# Patient Record
Sex: Male | Born: 1955 | Race: White | Hispanic: No | Marital: Married
Health system: Southern US, Community
[De-identification: ages and names within clinical notes are randomized; demographics above are authoritative.]

---

## 2001-03-29 ENCOUNTER — Ambulatory Visit (HOSPITAL_COMMUNITY): Admission: RE | Admit: 2001-03-29 | Discharge: 2001-03-29 | Payer: Self-pay

## 2013-07-03 ENCOUNTER — Inpatient Hospital Stay: Payer: Self-pay | Admitting: Internal Medicine

## 2013-07-03 LAB — PRO B NATRIURETIC PEPTIDE: B-Type Natriuretic Peptide: 11153 pg/mL — ABNORMAL HIGH (ref 0–125)

## 2013-07-03 LAB — CBC
HCT: 39.4 % — ABNORMAL LOW (ref 40.0–52.0)
HGB: 13.1 g/dL (ref 13.0–18.0)
MCH: 38.2 pg — ABNORMAL HIGH (ref 26.0–34.0)
MCHC: 33.2 g/dL (ref 32.0–36.0)
MCV: 115 fL — ABNORMAL HIGH (ref 80–100)
Platelet: 93 10*3/uL — ABNORMAL LOW (ref 150–440)
RBC: 3.43 10*6/uL — ABNORMAL LOW (ref 4.40–5.90)
RDW: 16.4 % — ABNORMAL HIGH (ref 11.5–14.5)
WBC: 26.4 10*3/uL — ABNORMAL HIGH (ref 3.8–10.6)

## 2013-07-03 LAB — BASIC METABOLIC PANEL
Anion Gap: 20 — ABNORMAL HIGH (ref 7–16)
BUN: 23 mg/dL — ABNORMAL HIGH (ref 7–18)
Calcium, Total: 8.7 mg/dL (ref 8.5–10.1)
Chloride: 98 mmol/L (ref 98–107)
Co2: 17 mmol/L — ABNORMAL LOW (ref 21–32)
Creatinine: 3.27 mg/dL — ABNORMAL HIGH (ref 0.60–1.30)
EGFR (African American): 23 — ABNORMAL LOW
EGFR (Non-African Amer.): 20 — ABNORMAL LOW
Glucose: 97 mg/dL (ref 65–99)
Osmolality: 274 (ref 275–301)
Potassium: 3.4 mmol/L — ABNORMAL LOW (ref 3.5–5.1)
Sodium: 135 mmol/L — ABNORMAL LOW (ref 136–145)

## 2013-07-03 LAB — PROTIME-INR
INR: 1.4
Prothrombin Time: 17.4 secs — ABNORMAL HIGH (ref 11.5–14.7)

## 2013-07-03 LAB — HEPATIC FUNCTION PANEL A (ARMC)
Albumin: 2.4 g/dL — ABNORMAL LOW (ref 3.4–5.0)
Alkaline Phosphatase: 134 U/L (ref 50–136)
SGOT(AST): 385 U/L — ABNORMAL HIGH (ref 15–37)
SGPT (ALT): 106 U/L — ABNORMAL HIGH (ref 12–78)

## 2013-07-03 LAB — DIFFERENTIAL
Basophil #: 0 10*3/uL (ref 0.0–0.1)
Basophil %: 0.2 %
Eosinophil #: 0 10*3/uL (ref 0.0–0.7)
Eosinophil %: 0 %
Lymphocyte #: 1.5 10*3/uL (ref 1.0–3.6)
Lymphocyte %: 5.6 %
Monocyte #: 1.5 x10 3/mm — ABNORMAL HIGH (ref 0.2–1.0)
Monocyte %: 5.6 %
Neutrophil #: 23.4 10*3/uL — ABNORMAL HIGH (ref 1.4–6.5)
Neutrophil %: 88.6 %

## 2013-07-03 LAB — ETHANOL
Ethanol %: 0.041 % (ref 0.000–0.080)
Ethanol: 41 mg/dL

## 2013-07-03 LAB — TROPONIN I: Troponin-I: 2.2 ng/mL — ABNORMAL HIGH

## 2013-07-03 LAB — CK-MB
CK-MB: 3.8 ng/mL — ABNORMAL HIGH (ref 0.5–3.6)
CK-MB: 5.8 ng/mL — ABNORMAL HIGH (ref 0.5–3.6)

## 2013-07-04 LAB — TROPONIN I: Troponin-I: 1.6 ng/mL — ABNORMAL HIGH

## 2013-07-04 LAB — CBC WITH DIFFERENTIAL/PLATELET
Basophil #: 0 10*3/uL (ref 0.0–0.1)
Basophil %: 0.2 %
Eosinophil #: 0 10*3/uL (ref 0.0–0.7)
HGB: 13 g/dL (ref 13.0–18.0)
Lymphocyte %: 5.1 %
MCH: 38.6 pg — ABNORMAL HIGH (ref 26.0–34.0)
MCHC: 34.2 g/dL (ref 32.0–36.0)
MCV: 113 fL — ABNORMAL HIGH (ref 80–100)
Monocyte #: 0.7 x10 3/mm (ref 0.2–1.0)
Monocyte %: 3.2 %
Neutrophil %: 91.5 %
RDW: 16.1 % — ABNORMAL HIGH (ref 11.5–14.5)
WBC: 21.7 10*3/uL — ABNORMAL HIGH (ref 3.8–10.6)

## 2013-07-04 LAB — DRUG SCREEN, URINE
Barbiturates, Ur Screen: NEGATIVE (ref ?–200)
Benzodiazepine, Ur Scrn: NEGATIVE (ref ?–200)
Cannabinoid 50 Ng, Ur ~~LOC~~: NEGATIVE (ref ?–50)
Cocaine Metabolite,Ur ~~LOC~~: NEGATIVE (ref ?–300)
MDMA (Ecstasy)Ur Screen: NEGATIVE (ref ?–500)
Methadone, Ur Screen: NEGATIVE (ref ?–300)
Opiate, Ur Screen: NEGATIVE (ref ?–300)
Phencyclidine (PCP) Ur S: NEGATIVE (ref ?–25)
Tricyclic, Ur Screen: NEGATIVE (ref ?–1000)

## 2013-07-04 LAB — BASIC METABOLIC PANEL
Anion Gap: 12 (ref 7–16)
BUN: 32 mg/dL — ABNORMAL HIGH (ref 7–18)
Calcium, Total: 8.1 mg/dL — ABNORMAL LOW (ref 8.5–10.1)
Co2: 25 mmol/L (ref 21–32)
EGFR (African American): 21 — ABNORMAL LOW
EGFR (Non-African Amer.): 18 — ABNORMAL LOW
Glucose: 112 mg/dL — ABNORMAL HIGH (ref 65–99)
Osmolality: 276 (ref 275–301)

## 2013-07-04 LAB — CLOSTRIDIUM DIFFICILE(ARMC)

## 2013-07-04 LAB — URIC ACID: Uric Acid: 8 mg/dL — ABNORMAL HIGH (ref 3.5–7.2)

## 2013-07-04 LAB — URINALYSIS, COMPLETE
Glucose,UR: NEGATIVE mg/dL (ref 0–75)
Ketone: NEGATIVE
Nitrite: NEGATIVE
Ph: 5 (ref 4.5–8.0)
WBC UR: 58 /HPF (ref 0–5)

## 2013-07-04 LAB — CK-MB: CK-MB: 7.1 ng/mL — ABNORMAL HIGH (ref 0.5–3.6)

## 2013-07-04 LAB — MAGNESIUM: Magnesium: 0.6 mg/dL — ABNORMAL LOW

## 2013-07-04 LAB — APTT
Activated PTT: >160 secs (ref 23.6–35.9)
Activated PTT: >160 secs (ref 23.6–35.9)

## 2013-07-05 LAB — CBC WITH DIFFERENTIAL/PLATELET
Basophil #: 0.1 10*3/uL (ref 0.0–0.1)
Basophil %: 0.3 %
Eosinophil #: 0 10*3/uL (ref 0.0–0.7)
Eosinophil %: 0 %
HCT: 36 % — ABNORMAL LOW (ref 40.0–52.0)
HGB: 12.3 g/dL — ABNORMAL LOW (ref 13.0–18.0)
Lymphocyte #: 1.1 10*3/uL (ref 1.0–3.6)
Lymphocyte %: 6.1 %
MCHC: 34.2 g/dL (ref 32.0–36.0)
MCV: 113 fL — ABNORMAL HIGH (ref 80–100)
Monocyte #: 0.7 x10 3/mm (ref 0.2–1.0)
Monocyte %: 4.2 %
Neutrophil #: 15.8 10*3/uL — ABNORMAL HIGH (ref 1.4–6.5)
Neutrophil %: 89.4 %
Platelet: 55 10*3/uL — ABNORMAL LOW (ref 150–440)
RBC: 3.2 10*6/uL — ABNORMAL LOW (ref 4.40–5.90)
RDW: 16.1 % — ABNORMAL HIGH (ref 11.5–14.5)
WBC: 17.7 10*3/uL — ABNORMAL HIGH (ref 3.8–10.6)

## 2013-07-05 LAB — COMPREHENSIVE METABOLIC PANEL
Alkaline Phosphatase: 61 U/L
Calcium, Total: 7.9 mg/dL — ABNORMAL LOW (ref 8.5–10.1)
Chloride: 97 mmol/L — ABNORMAL LOW (ref 98–107)
SGPT (ALT): 100 U/L — ABNORMAL HIGH (ref 12–78)
Total Protein: 8.2 g/dL (ref 6.4–8.2)

## 2013-07-06 LAB — BASIC METABOLIC PANEL
BUN: 46 mg/dL — ABNORMAL HIGH (ref 7–18)
Co2: 31 mmol/L (ref 21–32)
EGFR (African American): 56 — ABNORMAL LOW
EGFR (Non-African Amer.): 49 — ABNORMAL LOW
Osmolality: 283 (ref 275–301)
Potassium: 3.5 mmol/L (ref 3.5–5.1)

## 2013-07-06 LAB — CBC WITH DIFFERENTIAL/PLATELET
Basophil #: 0 10*3/uL (ref 0.0–0.1)
Basophil %: 0.2 %
Eosinophil #: 0 10*3/uL (ref 0.0–0.7)
Eosinophil %: 0 %
HGB: 12.2 g/dL — ABNORMAL LOW (ref 13.0–18.0)
Lymphocyte #: 0.9 10*3/uL — ABNORMAL LOW (ref 1.0–3.6)
Lymphocyte %: 7.6 %
MCHC: 33.6 g/dL (ref 32.0–36.0)
Monocyte %: 3.7 %
Neutrophil %: 88.5 %
RBC: 3.21 10*6/uL — ABNORMAL LOW (ref 4.40–5.90)
WBC: 11.9 10*3/uL — ABNORMAL HIGH (ref 3.8–10.6)

## 2013-07-07 LAB — URINALYSIS, COMPLETE
Bacteria: NONE SEEN
Ketone: NEGATIVE
Leukocyte Esterase: NEGATIVE
Ph: 6 (ref 4.5–8.0)
Protein: 30
RBC,UR: 176 /HPF (ref 0–5)
Specific Gravity: 1.018 (ref 1.003–1.030)
Squamous Epithelial: <1
WBC UR: 34 /HPF (ref 0–5)

## 2013-07-07 LAB — BASIC METABOLIC PANEL
Anion Gap: 4 — ABNORMAL LOW (ref 7–16)
BUN: 49 mg/dL — ABNORMAL HIGH (ref 7–18)
Chloride: 98 mmol/L (ref 98–107)
Co2: 32 mmol/L (ref 21–32)
Creatinine: 1.45 mg/dL — ABNORMAL HIGH (ref 0.60–1.30)
Glucose: 159 mg/dL — ABNORMAL HIGH (ref 65–99)
Osmolality: 285 (ref 275–301)
Potassium: 3.4 mmol/L — ABNORMAL LOW (ref 3.5–5.1)
Sodium: 134 mmol/L — ABNORMAL LOW (ref 136–145)

## 2013-07-07 LAB — CBC WITH DIFFERENTIAL/PLATELET
Basophil %: 0.1 %
Eosinophil #: 0 10*3/uL (ref 0.0–0.7)
HCT: 35.6 % — ABNORMAL LOW (ref 40.0–52.0)
Lymphocyte %: 5.5 %
MCH: 37.8 pg — ABNORMAL HIGH (ref 26.0–34.0)
MCHC: 33.4 g/dL (ref 32.0–36.0)
MCV: 113 fL — ABNORMAL HIGH (ref 80–100)
Monocyte %: 6.6 %
RDW: 15.5 % — ABNORMAL HIGH (ref 11.5–14.5)

## 2013-07-07 LAB — CULTURE, BLOOD (SINGLE)

## 2013-07-08 LAB — CBC WITH DIFFERENTIAL/PLATELET
Basophil #: 0 10*3/uL (ref 0.0–0.1)
Eosinophil %: 0 %
HCT: 37.5 % — ABNORMAL LOW (ref 40.0–52.0)
HGB: 12.8 g/dL — ABNORMAL LOW (ref 13.0–18.0)
Lymphocyte #: 0.9 10*3/uL — ABNORMAL LOW (ref 1.0–3.6)
MCHC: 34.2 g/dL (ref 32.0–36.0)
Monocyte #: 0.7 x10 3/mm (ref 0.2–1.0)
Monocyte %: 5.7 %
Neutrophil %: 87.6 %
RBC: 3.34 10*6/uL — ABNORMAL LOW (ref 4.40–5.90)

## 2013-07-08 LAB — BASIC METABOLIC PANEL
Anion Gap: 6 — ABNORMAL LOW (ref 7–16)
Calcium, Total: 8.8 mg/dL (ref 8.5–10.1)
Co2: 32 mmol/L (ref 21–32)
Creatinine: 1.26 mg/dL (ref 0.60–1.30)
EGFR (African American): >60
Potassium: 3.4 mmol/L — ABNORMAL LOW (ref 3.5–5.1)

## 2013-07-08 LAB — CULTURE, BLOOD (SINGLE)

## 2013-07-09 LAB — CBC WITH DIFFERENTIAL/PLATELET
Basophil #: 0 10*3/uL (ref 0.0–0.1)
Eosinophil #: 0 10*3/uL (ref 0.0–0.7)
Eosinophil %: 0 %
HCT: 38.4 % — ABNORMAL LOW (ref 40.0–52.0)
HGB: 13.4 g/dL (ref 13.0–18.0)
Lymphocyte #: 0.9 10*3/uL — ABNORMAL LOW (ref 1.0–3.6)
MCH: 38.9 pg — ABNORMAL HIGH (ref 26.0–34.0)
MCHC: 34.8 g/dL (ref 32.0–36.0)
MCV: 112 fL — ABNORMAL HIGH (ref 80–100)
Monocyte #: 0.5 x10 3/mm (ref 0.2–1.0)
Neutrophil #: 10.4 10*3/uL — ABNORMAL HIGH (ref 1.4–6.5)
Neutrophil %: 88.3 %
Platelet: 101 10*3/uL — ABNORMAL LOW (ref 150–440)
RBC: 3.43 10*6/uL — ABNORMAL LOW (ref 4.40–5.90)

## 2013-07-09 LAB — BASIC METABOLIC PANEL
Co2: 34 mmol/L — ABNORMAL HIGH (ref 21–32)
Creatinine: 1.21 mg/dL (ref 0.60–1.30)
Glucose: 162 mg/dL — ABNORMAL HIGH (ref 65–99)
Osmolality: 286 (ref 275–301)
Potassium: 3.5 mmol/L (ref 3.5–5.1)

## 2013-07-10 LAB — URINALYSIS, COMPLETE
Bacteria: NEGATIVE
Bilirubin,UR: NEGATIVE
Glucose,UR: NEGATIVE mg/dL (ref 0–75)
Ketone: NEGATIVE
Leukocyte Esterase: NEGATIVE
Protein: NEGATIVE
Specific Gravity: 1.005 (ref 1.003–1.030)
WBC UR: NONE SEEN /HPF (ref 0–5)

## 2013-07-10 LAB — PLATELET COUNT: Platelet: 105 10*3/uL — ABNORMAL LOW (ref 150–440)

## 2013-07-10 LAB — PROTEIN / CREATININE RATIO, URINE
Creatinine, Urine: 18.4 mg/dL — ABNORMAL LOW (ref 30.0–125.0)
Protein, Random Urine: 10 mg/dL (ref 0–12)

## 2013-07-10 LAB — CREATININE, SERUM
EGFR (African American): >60
EGFR (Non-African Amer.): >60

## 2013-07-10 LAB — POTASSIUM: Potassium: 3.6 mmol/L (ref 3.5–5.1)

## 2013-07-10 LAB — KAPPA/LAMBDA FREE LIGHT CHAINS (ARMC)

## 2013-07-10 LAB — PROTEIN ELECTROPHORESIS(ARMC)

## 2013-07-11 LAB — BASIC METABOLIC PANEL
BUN: 36 mg/dL — ABNORMAL HIGH (ref 7–18)
Calcium, Total: 8.2 mg/dL — ABNORMAL LOW (ref 8.5–10.1)
Co2: 38 mmol/L — ABNORMAL HIGH (ref 21–32)
Creatinine: 1.08 mg/dL (ref 0.60–1.30)
EGFR (African American): >60
EGFR (Non-African Amer.): >60
Glucose: 115 mg/dL — ABNORMAL HIGH (ref 65–99)
Osmolality: 285 (ref 275–301)
Sodium: 138 mmol/L (ref 136–145)

## 2014-12-04 NOTE — Consult Note (Signed)
Chief Complaint:  Subjective/Chief Complaint Pt c/o mild sob. He is still having mild left pain. Leg swelling.   VITAL SIGNS/ANCILLARY NOTES: **Vital Signs.:   22-Nov-14 12:11  Vital Signs Type Q 4hr  Temperature Temperature (F) 97.4  Celsius 36.3  Temperature Source oral  Pulse Pulse 94  Respirations Respirations 22  Systolic BP Systolic BP 300  Diastolic BP (mmHg) Diastolic BP (mmHg) 88  Mean BP 107  Pulse Ox % Pulse Ox % 94  Pulse Ox Activity Level  At rest  Oxygen Delivery 3L  *Intake and Output.:   Daily 22-Nov-14 07:00  Grand Totals Intake:  267 Output:  2065    Net:  -1798 24 Hr.:  -1798  Heparin      In:  154.5  IV (Secondary)      In:  87.5  IV (Secondary)      In:  25  Urine ml     Out:  2065  Length of Stay Totals Intake:  1041.1 Output:  2675    Net:  -1633.9   Brief Assessment:  GEN well developed, well nourished, no acute distress, obese   Cardiac Regular  murmur present  + LE edema  + JVD  --Gallop   Respiratory normal resp effort  wheezing  rhonchi   Gastrointestinal Normal   Gastrointestinal details normal Soft  Nontender  Nondistended   EXTR negative cyanosis/clubbing, positive edema   Lab Results: Routine Chem:  23-Nov-14 05:11   Glucose, Serum  146  BUN  46  Creatinine (comp)  1.57  Sodium, Serum  134  Potassium, Serum 3.5  Chloride, Serum 99  CO2, Serum 31  Calcium (Total), Serum 8.6  Anion Gap  4  Osmolality (calc) 283  eGFR (African American)  56  eGFR (Non-African American)  49 (eGFR values <70m/min/1.73 m2 may be an indication of chronic kidney disease (CKD). Calculated eGFR is useful in patients with stable renal function. The eGFR calculation will not be reliable in acutely ill patients when serum creatinine is changing rapidly. It is not useful in  patients on dialysis. The eGFR calculation may not be applicable to patients at the low and high extremes of body sizes, pregnant women, and vegetarians.)  Routine Hem:   23-Nov-14 05:11   WBC (CBC)  11.9  RBC (CBC)  3.21  Hemoglobin (CBC)  12.2  Hematocrit (CBC)  36.4  Platelet Count (CBC)  55  MCV  114  MCH  38.2  MCHC 33.6  RDW  15.8  Neutrophil % 88.5  Lymphocyte % 7.6  Monocyte % 3.7  Eosinophil % 0.0  Basophil % 0.2  Neutrophil #  10.5  Lymphocyte #  0.9  Monocyte # 0.4  Eosinophil # 0.0  Basophil # 0.0 (Result(s) reported on 06 Jul 2013 at 0[facility])   Radiology Results: XRay:    20-Nov-14 14:47, Chest Portable Single View  Chest Portable Single View   REASON FOR EXAM:    shortness of breath  COMMENTS:       PROCEDURE: DXR - DXR PORTABLE CHEST SINGLE VIEW  - Jul 03 2013  2:47PM     CLINICAL DATA:  Shortness of breath.    EXAM:  PORTABLE CHEST - 1 VIEW    COMPARISON:  None.    FINDINGS:  The cardiacsilhouette is enlarged. The mediastinal contours  otherwise unremarkable. Low lung volumes. There is no evidence of  focal infiltrates, effusions, nor edema. The osseous structures are  unremarkable.     IMPRESSION:  Cardiomegaly without  evidence of acute cardiopulmonary disease.      Electronically Signed    By: Margaree Mackintosh M.D.    On: 07/03/2013 14:55         Verified By: Mikki Santee, M.D., MD    [phone] 10:13, Chest PA and Lateral  Chest PA and Lateral   REASON FOR EXAM:    follow up [place]  COMMENTS:       PROCEDURE: DXR - DXR CHEST PA (OR AP) AND LATERAL  - Jul 05 2013 10:13AM     CLINICAL DATA:  Left lower extremity swelling.    EXAM:  CHEST  2 VIEW    COMPARISON:  July 03, 2013.    FINDINGS:  Stable cardiomegaly. No pleural effusion or pneumothorax is noted.  Right lung is clear. Minimal linear density is noted in left lung  base most consistent with subsegmental atelectasis. Bony thorax  appears intact.     IMPRESSION:  Minimal subsegmental atelectasis seen in left lung base. No other  abnormality seen.      Electronically Signed    By: Sabino Dick M.D.    On: 07/05/2013 10:15          Verified By: Marveen Reeks, M.D.,  [place]:    20-Nov-14 17:37, US Abdomen General Survey  US Abdomen General Survey   REASON FOR EXAM:    abd distention, elevated lfts, renal failure  COMMENTS:       PROCEDURE: [place]  - US ABDOMEN GENERAL SURVEY  - Jul 03 2013  5:37PM     CLINICAL DATA:  Abdominal distention, elevated LFTs, renal failure.    EXAM:  ULTRASOUND ABDOMEN COMPLETE    COMPARISON:  None.    FINDINGS:  Gallbladder  No gallstones or wall thickening visualized. No sonographic Murphy  sign noted.    Common bile duct    Diameter: 3.8 mm.    Liver    A paddle megaly at 23.4 cm measured along the midclavicular line.  Heterogeneous echotexture without gross focal abnormalities    IVC    No abnormality visualized.  Pancreas    Visualized portion unremarkable.    Spleen    Splenomegaly at 18 cm in longitudinal dimensions with a homogeneous  echotexture    RightKidney    Length: 13.4 cm. Echogenicity within normal limits. No mass or  hydronephrosis visualized.    Left Kidney  Length: 11.6 cm. Echogenicity within normal limits. No mass or  hydronephrosis visualized.    Abdominal aorta    Not visualized     IMPRESSION:  1. Hepatosplenomegaly  2. Hepatic steatosis      Electronically Signed    By: Margaree Mackintosh M.D.    On: 07/03/2013 17:42     Verified By: Mikki Santee, M.D., MD    [phone] 10:02, [place] Color Flow Doppler Low Extrem Bilat (Legs)  [place] Color Flow Doppler Low Extrem Bilat (Legs)   REASON FOR EXAM:    bilateral leg swelling  COMMENTS:       PROCEDURE: [place]  - US DOPPLER LOW EXTR BILATERAL  - Jul 05 2013 10:02AM     CLINICAL DATA:  Bilateral lower extremity swelling.    EXAM:  VENOUS DOPPLER ULTRASOUND OF BILATERAL LOWER EXTREMITIES    TECHNIQUE:  Gray-scale sonography with graded compression, as well as color  Doppler and duplex ultrasound, were performed to evaluate the deep  venous system from the level of the common  femoral vein through the  popliteal and proximal  calf veins. Spectral Doppler was utilized to  evaluate flow at rest and with distal augmentation maneuvers.    COMPARISON:  No priors.    FINDINGS:  Thrombus within deep veins:  None visualized.    Compressibility of deep veins:  Normal.    Duplex waveform respiratory phasicity:  Normal.    Duplex waveform response to augmentation:  Normal.    Venous reflux:  None visualized.  Other findings: Edema in the subcutaneous fat of the left lower  extremity near the ankle.     IMPRESSION:  1. No evidence deep venous thrombosis in either lower extremity.      Electronically Signed    By: Vinnie Langton M.D.    On: 07/05/2013 11:22         Verified By: Etheleen Mayhew, M.D.,  Cardiology:    20-Nov-14 14:18, ED ECG  Ventricular Rate 117  Atrial Rate 117  P-R Interval 134  QRS Duration 84  QT 348  QTc 485  P Axis 15  R Axis -58  T Axis 31  ECG interpretation   Sinus tachycardia  Left anterior fascicular block  Abnormal ECG  No previous ECGs available  ----------unconfirmed----------  Confirmed by OVERREAD, NOT ([id number]), editor PEARSON, BARBARA ([id number]) on 07/07/2013 1:07:54 PM  ED ECG     21-Nov-14 10:38, Echo Doppler  Echo Doppler   REASON FOR EXAM:      COMMENTS:       PROCEDURE: [facility] - ECHO DOPPLER COMPLETE(TRANSTHOR)  - Jul 04 2013 10:38AM     RESULT: Echocardiogram Report    Patient Name:   SAVIAN MAZON Date of Exam: 07/04/2013  Medical Rec #:  [id number]          Custom1:  Date of Birth:  [birth year]       Height:       70.1 in  Patient Age:    31 years        Weight:       280.0 lb  Patient Gender: M               BSA:          2.41 m??    Indications: SOB  Sonographer:    Sherrie Sport RDCS  Referring Phys: [place]stone Lighter    Summary:   1. Left ventricular ejection fraction, by visual estimation, is 70 to   75%.   2. Normal global left ventricular systolic function.   3. Mild to moderately increased left  ventricular internal cavity size.   4. Mildly dilated left atrium.   5. Mild aortic valve sclerosis without stenosis.   6. Severely increased left ventricular posterior wall thickness.   7. Mild tricuspid regurgitation.  2D AND M-MODE MEASUREMENTS (normal ranges within parentheses):  Left Ventricle:          Normal  IVSd (2D):      1.09 cm (0.7-1.1)  LVPWd (2D):     1.53 cm (0.7-1.1) Aorta/LA:                  Normal  LVIDd (2D):     5.58 cm (3.4-5.7) Aortic Root(2D): 3.10 cm (2.4-3.7)  LVIDs (2D):     3.29 cm           Left Atrium (2D): 4.40 cm (1.9-4.0)  LV FS (2D):     41.0 %   (>25%)  LV EF (2D):     71.3 %   (>50%)  Right Ventricle:                                    RVd (2D):        4.25 cm  LV DIASTOLIC FUNCTION:  MV Peak E: 1.20 m/s Decel Time: 137 msec  MV Peak A:0.91 m/s  E/A Ratio: 1.32  SPECTRAL DOPPLER ANALYSIS (where applicable):  Mitral Valve:  MV P1/2 Time: 39.73 msec  MV Area, PHT: 5.54 cm??  Aortic Valve: AoV Max Vel: 2.08 m/s AoV Peak PG: 17.2 mmHg AoV Mean PG:     15.0 mmHg  LVOT Vmax: 1.09 m/s LVOTVTI: 0.244 m LVOT Diameter: 2.20 cm  AoV Area, Vmax: 2.00 cm?? AoV Area, VTI: 2.20 cm?? AoV Area, Vmn: 1.96 cm??  Tricuspid Valve and PA/RV Systolic Pressure: TR Max Velocity: 2.22 m/s RA   Pressure: 5 mmHg RVSP/PASP: 24.8 mmHg  Pulmonic Valve:  PV Max Velocity: 1.30 m/s PV Max PG: 6.8 mmHg PV Mean PG:    PHYSICIAN INTERPRETATION:  Left Ventricle: The left ventricular internal cavity size was mild to   moderately increased. LV septal wall thickness was normal. LV posterior   wall thickness was severely increased. Global LV systolic function was   normal. Left ventricular ejection fraction, by visual estimation, is 70   to 75%.  Right Ventricle: The right ventricular size is normal. Global RV systolic     function is normal.  Left Atrium: The left atrium is mildly dilated.  Right Atrium: The right atrium is normal in  size.  Pericardium: There is no evidence of pericardial effusion.  Mitral Valve: The mitral valve is normal in structure. Trace mitral valve   regurgitation is seen.  Tricuspid Valve: The tricuspid valve is normal. Mild tricuspid   regurgitation is visualized. The tricuspid regurgitant velocity is 2.22   m/s, and with an assumed right atrial pressure of 5 mmHg, the estimated   right ventricular systolic pressure is normal at 24.8 mmHg.  Aortic Valve: The aortic valve is normal. Mild aortic valve sclerosis is   present, with no evidence of aortic valve stenosis. No evidence of aortic   valve regurgitation is seen.  Pulmonic Valve: The pulmonic valve is normal.  4 Quindarius Cabello MD  Electronically signed by Coy MD  Signature Date/Time: 07/04/2013/3:35:12 PM    *** Final ***    IMPRESSION: .        Verified By: Yolonda Kida, M.D., MD  CT:    20-Nov-14 19:10, CT Chest Abdomen and Pelvis WO  CT Chest Abdomen and Pelvis WO   REASON FOR EXAM:    (1) severe sepsis, CHF; (2) severe sepsis, elevated   lactic acid, unknown source  COMMENTS:       PROCEDURE: CT  - CT CHEST ABDOMEN AND PELVIS WO  - Jul 03 2013  7:10PM     CLINICAL DATA:  Chest pain and short of breath, confusion. Ethanol  abuse. Congestive heart failure. Sepsis. Elevated lactic acid.    EXAM:  CT CHEST, ABDOMEN AND PELVIS WITHOUT CONTRAST    TECHNIQUE:  Multidetector CT imaging of the chest, abdomen and pelvis was  performed following the standard protocol without IV contrast.  COMPARISON:  Ultrasound on 07/03/2013    FINDINGS:  CT CHEST FINDINGS    No axillary supraclavicular lymphadenopathy. No mediastinal hilar  lymphadenopathy. No pericardial fluid.    No focal consolidation. No pleural fluid or pneumothorax.  There is  mild interlobular septal thickening consistent with mild edema.  There is some atelectasis at the left lung base this small focus of  consolidation pneumonitis.  (image 37).    No focal hepatic lesion on this noncontrast exam.  CT ABDOMEN AND PELVIS FINDINGS    No focal hepatic lesion on this noncontrast exam. Diffuse hepatic  steatosis noted. Enlarged caudate lobe. No ascites. The gallbladder,  pancreas, spleen, adrenal glands, kidneys are normal. The stomach,  small bowel, and cecum are normal. The colon is collapse.    Abdominal or is normal caliber. No retroperitoneal periportal  lymphadenopathy. No central mesenteric disease.    No free fluid the pelvis. This Foley catheter in the collapsed  bladder. There is an enlarged left external iliac lymph node  measuring 11 mm short axis. There is enlarged left inguinal lymph  node measuring 13 mm (image 29). There is mild subcutaneous edema  within the left upper thigh.   IMPRESSION:  CT CHEST IMPRESSION    1. Mild atelectasis and potential pneumonitis or pneumonia at the  left lung base. Consider mild aspiration pneumonitis.  2. Mild interstitial edema.    CT ABDOMEN AND PELVIS IMPRESSION    1. Hepatic steatosis.  2. Enlargement of the caudate lobe suggests cirrhotic change.  3. Left external iliac adenopathy and inguinal adenopathy with  associated subcutaneous edema in the proximal left thigh. Query left  lower extremity infection or thrombus.  Electronically Signed    By: Suzy Bouchard M.D.    On: 11/20/201419:32         Verified By: Rennis Golden, M.D.,   Assessment/Plan:  Invasive Device Daily Assessment of Necessity:  Does the patient currently have any of the following indwelling devices? foley  central line catheter   Indwelling Urinary Catheter continued, requirement due to   Reason to continue Indwelling Urinary Catheter for strict Intake/Output monitoring for hemodynamic instability  immobilization due to sedation/paralysis   Central Line continued, requirement due to   Reason to continue [employer] for frequent venous blood sampling    Assessment/Plan:  Assessment IMP Elevated troponin ARF OSA HTN Edema Sepsis Hepatitis ETOH abuse Pneumonia Psoriatic arthritits . Marland Kitchen   Plan PLAN Antibx IV 02 [place] 2L Agree with nephrology input CIWA for ETOH Bp control DVT prophylaxis Hold off on lasix for now Awaiting blood culture results Agree with ECHO   Electronic Signatures: Lujean Amel D (MD)  (Signed [phone] 20:04)  Authored: Chief Complaint, VITAL SIGNS/ANCILLARY NOTES, Brief Assessment, Lab Results, Radiology Results, Assessment/Plan   Last Updated: 26-Nov-14 20:04 by Lujean Amel D (MD)

## 2014-12-04 NOTE — Consult Note (Signed)
Chief Complaint:  Subjective/Chief Complaint Feeling better today. Much more alert.Pt c/o mild sob. He is still having mild left pain. Leg swelling. Denies f/c/s.   VITAL SIGNS/ANCILLARY NOTES: **Vital Signs.:   23-Nov-14 08:48  Pulse Pulse 85  Respirations Respirations 20  Systolic BP Systolic BP 962  Diastolic BP (mmHg) Diastolic BP (mmHg) 88  Mean BP 105  Pulse Ox % Pulse Ox % 96  Oxygen Delivery 3L; Nasal Cannula  *Intake and Output.:   23-Nov-14 05:44  Grand Totals Intake:  571 Output:  450    Net:  121 54 Hr.:  -464  IV (Primary)      In:  521  IV (Secondary)      In:  50  Weight Type daily  Weight Method Bed  Current Weight (lbs) (lbs) 276  Current Weight (kg) (kg) 125.1  Height (ft) (feet) 5  Height (in) (in) 10  Height (cm) centimeters 177.8  BSA (m2) 2.3  BMI (kg/m2) 39.5  Urine ml     Out:  450  Urinary Method  Foley   Brief Assessment:  (Data referenced from "Consultant's Daily Progress Note" 22-Nov-14 10:48)  GEN well developed, well nourished, no acute distress, obese   Cardiac Regular  murmur present  + LE edema  + JVD  --Gallop   Respiratory normal resp effort  wheezing  rhonchi   Gastrointestinal Normal   Gastrointestinal details normal Soft  Nontender  Nondistended   EXTR negative cyanosis/clubbing, positive edema   Lab Results: Routine Chem:  23-Nov-14 05:11   eGFR (African American)  56  eGFR (Non-African American)  49 (eGFR values <38m/min/1.73 m2 may be an indication of chronic kidney disease (CKD). Calculated eGFR is useful in patients with stable renal function. The eGFR calculation will not be reliable in acutely ill patients when serum creatinine is changing rapidly. It is not useful in  patients on dialysis. The eGFR calculation may not be applicable to patients at the low and high extremes of body sizes, pregnant women, and vegetarians.)  Potassium, Serum 3.5  Glucose, Serum  146  BUN  46  Sodium, Serum  134  Chloride,  Serum 99  CO2, Serum 31  Calcium (Total), Serum 8.6  Anion Gap  4  Osmolality (calc) 283  Routine Hem:  23-Nov-14 05:11   Platelet Count (CBC)  55  WBC (CBC)  11.9  RBC (CBC)  3.21  Hemoglobin (CBC)  12.2  Hematocrit (CBC)  36.4  MCV  114  MCH  38.2  MCHC 33.6  RDW  15.8  Neutrophil % 88.5  Lymphocyte % 7.6  Monocyte % 3.7  Eosinophil % 0.0  Basophil % 0.2  Neutrophil #  10.5  Lymphocyte #  0.9  Monocyte # 0.4  Eosinophil # 0.0  Basophil # 0.0 (Result(s) reported on 06 Jul 2013 at 0[facility])   Radiology Results: XRay:    20-Nov-14 14:47, Chest Portable Single View  Chest Portable Single View   REASON FOR EXAM:    shortness of breath  COMMENTS:       PROCEDURE: DXR - DXR PORTABLE CHEST SINGLE VIEW  - Jul 03 2013  2:47PM     CLINICAL DATA:  Shortness of breath.    EXAM:  PORTABLE CHEST - 1 VIEW    COMPARISON:  None.    FINDINGS:  The cardiacsilhouette is enlarged. The mediastinal contours  otherwise unremarkable. Low lung volumes. There is no evidence of  focal infiltrates, effusions, nor edema. The osseous structures are  unremarkable.  IMPRESSION:  Cardiomegaly without evidence of acute cardiopulmonary disease.      Electronically Signed    By: Margaree Mackintosh M.D.    On: 07/03/2013 14:55         Verified By: Mikki Santee, M.D., MD    [phone] 10:13, Chest PA and Lateral  Chest PA and Lateral   REASON FOR EXAM:    follow up p[place]  COMMENTS:       PROCEDURE: DXR - DXR CHEST PA (OR AP) AND LATERAL  - Jul 05 2013 10:13AM     CLINICAL DATA:  Left lower extremity swelling.    EXAM:  CHEST  2 VIEW    COMPARISON:  July 03, 2013.    FINDINGS:  Stable cardiomegaly. No pleural effusion or pneumothorax is noted.  Right lung is clear. Minimal linear density is noted in left lung  base most consistent with subsegmental atelectasis. Bony thorax  appears intact.     IMPRESSION:  Minimal subsegmental atelectasis seen in left lung base. No  other  abnormality seen.      Electronically Signed    By: Sabino Dick M.D.    On: 07/05/2013 10:15         Verified By: Marveen Reeks, M.D.,  [place]:    20-Nov-14 17:37, US Abdomen General Survey  US Abdomen General Survey   REASON FOR EXAM:    abd distention, elevated lfts, renal failure  COMMENTS:       PROCEDURE: [place]  - US ABDOMEN GENERAL SURVEY  - Jul 03 2013  5:37PM     CLINICAL DATA:  Abdominal distention, elevated LFTs, renal failure.    EXAM:  ULTRASOUND ABDOMEN COMPLETE    COMPARISON:  None.    FINDINGS:  Gallbladder  No gallstones or wall thickening visualized. No sonographic Murphy  sign noted.    Common bile duct    Diameter: 3.8 mm.    Liver    A paddle megaly at 23.4 cm measured along the midclavicular line.  Heterogeneous echotexture without gross focal abnormalities    IVC    No abnormality visualized.  Pancreas    Visualized portion unremarkable.    Spleen    Splenomegaly at 18 cm in longitudinal dimensions with a homogeneous  echotexture    RightKidney    Length: 13.4 cm. Echogenicity within normal limits. No mass or  hydronephrosis visualized.    Left Kidney  Length: 11.6 cm. Echogenicity within normal limits. No mass or  hydronephrosis visualized.    Abdominal aorta    Not visualized     IMPRESSION:  1. Hepatosplenomegaly  2. Hepatic steatosis      Electronically Signed    By: Margaree Mackintosh M.D.    On: 07/03/2013 17:42     Verified By: Mikki Santee, M.D., MD    [phone] 10:02, [place] Color Flow Doppler Low Extrem Bilat (Legs)  [place] Color Flow Doppler Low Extrem Bilat (Legs)   REASON FOR EXAM:    bilateral leg swelling  COMMENTS:       PROCEDURE: [place]  - US DOPPLER LOW EXTR BILATERAL  - Jul 05 2013 10:02AM     CLINICAL DATA:  Bilateral lower extremity swelling.    EXAM:  VENOUS DOPPLER ULTRASOUND OF BILATERAL LOWER EXTREMITIES    TECHNIQUE:  Gray-scale sonography with graded compression, as well as color  Doppler  and duplex ultrasound, were performed to evaluate the deep  venous system from the level of the common femoral vein through the  popliteal and proximal calf veins. Spectral Doppler was utilized to  evaluate flow at rest and with distal augmentation maneuvers.    COMPARISON:  No priors.    FINDINGS:  Thrombus within deep veins:  None visualized.    Compressibility of deep veins:  Normal.    Duplex waveform respiratory phasicity:  Normal.    Duplex waveform response to augmentation:  Normal.    Venous reflux:  None visualized.  Other findings: Edema in the subcutaneous fat of the left lower  extremity near the ankle.     IMPRESSION:  1. No evidence deep venous thrombosis in either lower extremity.      Electronically Signed    By: Vinnie Langton M.D.    On: 07/05/2013 11:22         Verified By: Etheleen Mayhew, M.D.,  Cardiology:    20-Nov-14 14:18, ED ECG  Ventricular Rate 117  Atrial Rate 117  P-R Interval 134  QRS Duration 84  QT 348  QTc 485  P Axis 15  R Axis -58  T Axis 31  ECG interpretation   Sinus tachycardia  Left anterior fascicular block  Abnormal ECG  No previous ECGs available  ----------unconfirmed----------  Confirmed by OVERREAD, NOT ([id number]), editor PEARSON, BARBARA ([id number]) on 07/07/2013 1:07:54 PM  ED ECG     21-Nov-14 10:38, Echo Doppler  Echo Doppler   REASON FOR EXAM:      COMMENTS:       PROCEDURE: [facility] - ECHO DOPPLER COMPLETE(TRANSTHOR)  - Jul 04 2013 10:38AM     RESULT: Echocardiogram Report    Patient Name:   TALLIN HART Date of Exam: 07/04/2013  Medical Rec #:  [id number]          Custom1:  Date of Birth:  05/16/1956       Height:       70.1 in  Patient Age:    59 years        Weight:       280.0 lb  Patient Gender: M               BSA:          2.41 m??    Indications: SOB  Sonographer:    Sherrie Sport RDCS  Referring Phys: Gladstone Lighter    Summary:   1. Left ventricular ejection fraction, by visual estimation, is 70 to    75%.   2. Normal global left ventricular systolic function.   3. Mild to moderately increased left ventricular internal cavity size.   4. Mildly dilated left atrium.   5. Mild aortic valve sclerosis without stenosis.   6. Severely increased left ventricular posterior wall thickness.   7. Mild tricuspid regurgitation.  2D AND M-MODE MEASUREMENTS (normal ranges within parentheses):  Left Ventricle:          Normal  IVSd (2D):      1.09 cm (0.7-1.1)  LVPWd (2D):     1.53 cm (0.7-1.1) Aorta/LA:                  Normal  LVIDd (2D):     5.58 cm (3.4-5.7) Aortic Root (2D): 3.10 cm (2.4-3.7)  LVIDs (2D):     3.29 cm           Left Atrium (2D): 4.40 cm (1.9-4.0)  LV FS (2D):     41.0 %   (>25%)  LV EF (2D):     71.3 %   (>50%)  Right Ventricle:                                    RVd (2D):        6.01 cm  LV DIASTOLIC FUNCTION:  MV Peak E: 1.20 m/s Decel Time: 137 msec  MV Peak A:0.91 m/s  E/A Ratio: 1.32  SPECTRAL DOPPLER ANALYSIS (where applicable):  Mitral Valve:  MV P1/2 Time: 39.73 msec  MV Area, PHT: 5.54 cm??  Aortic Valve: AoV Max Vel: 2.08 m/s AoV Peak PG: 17.2 mmHg AoV Mean PG:     15.0 mmHg  LVOT Vmax: 1.09 m/s LVOTVTI: 0.244 m LVOT Diameter: 2.20 cm  AoV Area, Vmax: 2.00 cm?? AoV Area, VTI: 2.20 cm?? AoV Area, Vmn: 1.96 cm??  Tricuspid Valve and PA/RV Systolic Pressure: TR Max Velocity: 2.22 m/s RA   Pressure: 5 mmHg RVSP/PASP: 24.8 mmHg  Pulmonic Valve:  PV Max Velocity: 1.30 m/s PV Max PG: 6.8 mmHg PV Mean PG:    PHYSICIAN INTERPRETATION:  Left Ventricle: The left ventricular internal cavity size was mild to   moderately increased. LV septal wall thickness was normal. LV posterior   wall thickness was severely increased. Global LV systolic function was   normal. Left ventricular ejection fraction, by visual estimation, is 70   to 75%.  Right Ventricle: The right ventricular size is normal. Global RV systolic     function is  normal.  Left Atrium: The left atrium is mildly dilated.  Right Atrium: The right atrium is normal in size.  Pericardium: There is no evidence of pericardial effusion.  Mitral Valve: The mitral valve is normal in structure. Trace mitral valve   regurgitation is seen.  Tricuspid Valve: The tricuspid valve is normal. Mild tricuspid   regurgitation is visualized. The tricuspid regurgitant velocity is 2.22   m/s, and with an assumed right atrial pressure of 5 mmHg, the estimated   right ventricular systolic pressure is normal at 24.8 mmHg.  Aortic Valve: The aortic valve is normal. Mild aortic valve sclerosis is   present, with no evidence of aortic valve stenosis. No evidence of aortic   valve regurgitation is seen.  Pulmonic Valve: The pulmonic valve is normal.  48 DwayneCallwood MD  Electronically signed by Monrovia MD  Signature Date/Time: 07/04/2013/3:35:12 PM    *** Final ***    IMPRESSION: .        Verified By: Yolonda Kida, M.D., MD  CT:    20-Nov-14 19:10, CT Chest Abdomen and Pelvis WO  CT Chest Abdomen and Pelvis WO   REASON FOR EXAM:    (1) severe sepsis, CHF; (2) severe sepsis, elevated   lactic acid, unknown source  COMMENTS:       PROCEDURE: CT  - CT CHEST ABDOMEN AND PELVIS WO  - Jul 03 2013  7:10PM     CLINICAL DATA:  Chest pain and short of breath, confusion. Ethanol  abuse. Congestive heart failure. Sepsis. Elevated lactic acid.    EXAM:  CT CHEST, ABDOMEN AND PELVIS WITHOUT CONTRAST    TECHNIQUE:  Multidetector CT imaging of the chest, abdomen and pelvis was  performed following the standard protocol without IV contrast.  COMPARISON:  Ultrasound on 07/03/2013    FINDINGS:  CT CHEST FINDINGS    No axillary supraclavicular lymphadenopathy. No mediastinal hilar  lymphadenopathy. No pericardial fluid.    No focal consolidation. No pleural fluid or pneumothorax.  There is  mild interlobular septal thickening consistent with mild  edema.  There is some atelectasis at the left lung base this small focus of  consolidation pneumonitis. (image 37).    No focal hepatic lesion on this noncontrast exam.  CT ABDOMEN AND PELVIS FINDINGS    No focal hepatic lesion on this noncontrast exam. Diffuse hepatic  steatosis noted. Enlarged caudate lobe. No ascites. The gallbladder,  pancreas, spleen, adrenal glands, kidneys are normal. The stomach,  small bowel, and cecum are normal. The colon is collapse.    Abdominal or is normal caliber. No retroperitoneal periportal  lymphadenopathy. No central mesenteric disease.    No free fluid the pelvis. This Foley catheter in the collapsed  bladder. There is an enlarged left external iliac lymph node  measuring 11 mm short axis. There is enlarged left inguinal lymph  node measuring 13 mm (image 29). There is mild subcutaneous edema  within the left upper thigh.   IMPRESSION:  CT CHEST IMPRESSION    1. Mild atelectasis and potential pneumonitis or pneumonia at the  left lung base. Consider mild aspiration pneumonitis.  2. Mild interstitial edema.    CT ABDOMEN AND PELVIS IMPRESSION    1. Hepatic steatosis.  2. Enlargement of the caudate lobe suggests cirrhotic change.  3. Left external iliac adenopathy and inguinal adenopathy with  associated subcutaneous edema in the proximal left thigh. Query left  lower extremity infection or thrombus.  Electronically Signed    By: Suzy Bouchard M.D.    On: 11/20/201419:32         Verified By: Rennis Golden, M.D.,   Assessment/Plan:  Invasive Device Daily Assessment of Necessity:  (Data referenced from "Consultant's Daily Progress Note" 22-Nov-14 10:48)  Does the patient currently have any of the following indwelling devices? foley  central line catheter   Indwelling Urinary Catheter continued, requirement due to   Reason to continue Indwelling Urinary Catheter for strict Intake/Output monitoring for hemodynamic instability   immobilization due to sedation/paralysis   Central Line continued, requirement due to   Reason to continue [employer] for frequent venous blood sampling   Assessment/Plan:  Assessment IMP Demand ischemia,Elevated troponin Chronic immunosuppression  ARF OSA HTN Edema Sepsis Hepatitis ETOH abuse Pneumonia Psoriatic arthritits Morbid obesity . Marland Kitchen   Plan PLAN Broad spectrum /Antibx IV Supplemental 02 [place] 2L Agree with nephrology input CIWA for ETOH withdrawl Bp control DVT prophylaxis Hold off on lasix for now because of ARF Awaiting blood culture results Agree with ECHO Agree with Transfer to tele F/U elevated WBC   Electronic Signatures: Lujean Amel D (MD)  (Signed 27-Nov-14 09:17)  Authored: Chief Complaint, VITAL SIGNS/ANCILLARY NOTES, Brief Assessment, Lab Results, Radiology Results, Assessment/Plan   Last Updated: 27-Nov-14 09:17 by Lujean Amel D (MD)

## 2014-12-04 NOTE — H&P (Signed)
PATIENT NAME:  Oscar Lin, Oscar Lin MR#:  [id number] DATE OF BIRTH:  1956-04-09  DATE OF ADMISSION:  07/03/2013   ADMITTING PHYSICIAN: Enid Baasadhika Gaylan Fauver.   PRIMARY CARE PHYSICIAN AND PRIMARY RHEUMATOLOGIST:  Dr. Lavenia AtlasWally Kernodle.   CHIEF COMPLAINT: Breathing difficulty.   HISTORY OF PRESENT ILLNESS: Oscar Lin is a 59 year old obese, Caucasian male with past medical history significant for psoriatic arthritis, who is on Humira and being followed by Dr. Gavin PottersKernodle, comes to the hospital secondary to not feeling well for almost 2 weeks. The patient is a very poor historian. He drinks almost 10 vodkas every day and has been feeling poorly for the past 2 weeks with worsening shortness of breath. He denies any chest pain. No cough. No fevers or chills at home. He shares the house with a friend, and denies any sick contacts or recent travel. No exposure to tuberculosis as well. He comes to the hospital from home with some confusion and difficulty breathing. He was noted to have a fever of 103 degrees Fahrenheit, sats 88% on room air and elevated white count, and also noted to have multiorgan failure.   PAST MEDICAL HISTORY:  1.  Significant alcohol abuse.  2.  Psoriatic arthritis.   PAST SURGICAL HISTORY: Appendectomy.   ALLERGIES TO MEDICATIONS: No known drug allergies.   CURRENT HOME MEDICATIONS: Include:  1.  Humira 40 mg/0.8 mL subcutaneous - 8.8 mL subcutaneously every other week on Tuesday.   2.  Aspirin 81 mg 2 tablets once a day.  3.  Prilosec 20 mg p.o. every other day.   SOCIAL HISTORY: Lives at home, sharing his home with a friend. Quit smoking about 10 years ago, and drinks about 5 to 10 vodkas every day.    FAMILY HISTORY: Both parents with kidney disease, but not on dialysis yet.    REVIEW OF SYSTEMS: CONSTITUTIONAL: Positive for fever, fatigue and weakness.  EYES: No blurred vision, double vision, glaucoma or cataracts.  ENT: No tinnitus, ear pain, hearing loss, epistaxis or  discharge.  RESPIRATORY: Positive for breathing difficulty. Cough. No wheeze or COPD.  CARDIOVASCULAR: No chest pain. Positive for orthopnea. No PND. No arrhythmias or syncope.  GASTROINTESTINAL: Positive for nausea and vomiting. No diarrhea. No hematemesis or melena.  GENITOURINARY: No dysuria, hematuria, renal calculus, frequency or incontinence.  ENDOCRINE: No polyuria, nocturia, thyroid problems, heat or cold intolerance.  HEMATOLOGY: No anemia, easy bruising or bleeding.  SKIN: No acne, rash or lesions.   MUSCULOSKELETAL: :Generalized body aches. No arthritis or gout.  NEUROLOGIC: Confusion.  No numbness, weakness, CVA, TIA or seizures.  PSYCHOLOGIC: No anxiety, insomnia or depression.   PHYSICAL EXAMINATION:   VITAL SIGNS: Temperature 103 degrees Fahrenheit, pulse 115, respirations 22, blood pressure 110/53, pulse ox 89% on room air.  GENERAL: Heavily built, well-nourished male in moderate distress secondary to breathing difficulty.  HEENT: Normocephalic, atraumatic. Pupils equal, round, reacting to light. Anicteric sclerae. Extraocular movements intact.  Oropharynx clear without erythema, mass or exudate.  NECK: Supple. No thyromegaly, JVD or carotid bruits. No lymphadenopathy.  LUNGS: Moving air bilaterally. Has bibasilar rhonchi without any wheeze, but he has some audible wheeze in the upper airway tract.  Minimal use of accessory muscles for breathing on exertion. No crackles noted.  ABDOMEN: Obese. Distended. Nontender. Hypoactive bowel sounds heard.  EXTREMITIES: He does have 3+ edema with no clubbing or cyanosis. Difficult to palpate dorsalis pedis pulses.  SKIN: No acne, rash or lesions.  LYMPHATICS: No cervical lymphadenopathy.  NEUROLOGIC: Cranial nerves II through  XII remain intact. Gross motor strength is 5/5 in all 4 extremities and sensation intact.  PSYCHOLOGIC:  The patient is awake, alert, oriented x 3 with intermittent confusion.   LABORATORY DATA: WBC 26.4,  hemoglobin 13.1, hematocrit 39.4, platelet count 93.   Sodium 135, potassium  3.4, chloride 98, bicarb 17, BUN 23, creatinine 3.27, glucose 97 and calcium of 8.7. BNP is elevated at 11,153. INR is 1.4. His total bilirubin is 4.7, direct bilirubin 3.7, ALT 106, AST 385, albumin of 2.4. Alcohol within normal limits. Chest x-ray showing cardiomegaly without evidence of acute cardiopulmonary disease, low lung volumes and interstitial markings noted without overt pulmonary edema.   ABG showing pH of 7.34, pCO2 of 30, pO2 of 74, bicarb of 16.2 and sats of 97% on 2 liters oxygen. Abdominal ultrasound showing hepatomegaly, splenomegaly and hepatic steatosis. Normal kidneys.   First set of enzymes showing CK-MB 3.8, troponin 2.2. Second set of enzymes showing CK-MB of 5.8 and troponin of 1.9.   CT of the chest, abdomen and pelvis done without contrast showing mild atelectasis, possible pneumonitis or pneumonia at the left lung base, consider aspiration pneumonitis. Mild interstitial edema.   CT of the abdomen showing hepatic steatosis, enlargement of caudate lobe suggesting cirrhosis. Left external iliac adenopathy and inguinal adenopathy with associated subcutaneous edema in the proximal left thigh.   EKG: Sinus tachycardia. No acute ST-T wave changes.   ASSESSMENT AND PLAN: A 59 year old obese male with history alcohol abuse, psoriatic arthritis on Humira, comes for acute respiratory failure, sepsis, acute renal failure and elevated liver function tests.  1.  Severe sepsis. Possible source could be pneumonia. Urinalysis is still pending at this time. We will admit to CCU.  Blood pressure is stable for now. Blood cultures have been ordered and urinalysis ordered.  We will start on  broad-spectrum antibiotics with vanc, Zosyn and Levaquin. CT chest confirms possible left lower lobe pneumonitis versus pneumonia. Aspiration could not be ruled out because he is an alcoholic.  2.  Acute respiratory failure with  hypoxia, tachypnea on exam. Stat ABG ordered. Possible metabolic acidosis, and  trying to compensate. Chest x-ray showing no pulmonary vascular congestion. He already did receive a dose of Lasix, but very dry on exam. Ordered an echocardiogram. Continue antibiotics.  3.  Elevated troponins, likely demand ischemia from sepsis, and also renal failure versus non-ST elevation myocardial infarction.  Can start on IV heparin. Recycle troponins, cardiology consult. 4.  Acute renal failure with no prior labs available. Labs from Dr. [place] office have been requested, and his creatinine from July 2014 is 0.7 with BUN of 9 at the time. Renal ultrasound and abdominal ultrasound shows normal kidneys, so hold off on diuretics and maybe gentle IV fluids for now.  Nephrology consult requested and Foley catheter for strict I's and O's monitoring.  5.  Elevated liver function tests.  The  patient has hepatomegaly with cirrhotic changes on the ultrasound; however, from July, ALT was within normal limits at 25, and AST was elevated at 60,  possibly elevated from sepsis at this time.  6.  Severe alcohol abuse. The patient will be placed on CIWA protocol.  7.  Acute thrombocytopenia, likely from sepsis. Continue to monitor, especially while on heparin drip.  8.  Bilateral lower extremity edema, likely from congestive heart failure.  Follow up echo.  Not on diuretics at this time.  Also, Doppler ultrasound to rule out DVT.  CODE STATUS: FULL CODE.   Total critical care time  spent on this patient is 65 minutes.      ____________________________ Enid Baas, MD rk:dmm D: 07/03/2013 19:57:06 ET T: 07/03/2013 20:54:57 ET JOB#: [id number]  cc: Enid Baas, MD, <Dictator> Helen Hashimoto Royann Shivers., MD Enid Baas MD ELECTRONICALLY SIGNED 07/08/2013 17:53

## 2014-12-04 NOTE — Discharge Summary (Signed)
PATIENT NAME:  Oscar Lin, Oscar Lin MR#:  [id number] DATE OF BIRTH:  [birth year]  DATE OF ADMISSION:  07/03/2013 DATE OF DISCHARGE:  07/11/2013  PRIMARY CARE PHYSICIAN:  Dr. Terance HartBronstein.   FINAL DIAGNOSES:  1.  Severe sepsis with cellulitis, Strep pyogenes in blood culture and pneumonia.  2.  Acute respiratory failure.  3.  Elevated troponin.  4.  Acute renal failure.  5.  Elevated liver function tests.  6.  Alcohol abuse.  7.  Edema.  8.  Thrombocytopenia.  9.  Thrush.   MEDICATIONS ON DISCHARGE:  Include:  1.  Prilosec 20 mg daily.  2.  Acetaminophen hydrocodone 325/5 mg 1 tablet every 6 hours as needed for pain.  3.  Fluconazole 100 mg daily until completed.  4.  Aspirin 81 mg daily.  5.  Advair Diskus 250/50 one inhalation twice a day.  6.  Spiriva 1 inhalation daily.  7.  Cephalexin 500 mg 4 times a day for 10 days.  8.  Lasix 40 mg daily.  9.  Klor-Con 10 mEq daily.  Physical Therapy recommended Home Health so Home Health set up. The patient will probably refuse. I did advise him to elevate leg on 6 pillows while in bed.   DIET:  Low-sodium diet.  FOLLOW UP:  With Dr. Sampson GoonFitzgerald, Infectious Disease. Followup 1 to 2 weeks with Dr. Terance HartBronstein.   HOSPITAL COURSE:  The patient was admitted 07/03/2013 and discharged 07/11/2013.   CHIEF COMPLAINT:  Difficulty breathing.   HISTORY OF PRESENT ILLNESS:  This is a 59 year old man with psoriatic arthritis on Humira. The patient was not feeling well for couple of weeks. He drinks quite a bit of alcohol per day, came in with a fever of 103, O2 sats of 80%. He was also in multiorgan failure. He was admitted with a severe sepsis, admitted to the Critical Care Unit. Blood cultures ordered. He was initially started on Zosyn, Levaquin and vancomycin was added. For his acute respiratory failure, he was started on oxygen. For his elevated troponin, likely demand ischemia from severe sepsis. Elevated liver function tests could be secondary to sepsis or  alcohol. For his alcohol abuse, he was put on CIWA protocol. For his acute thrombocytopenia, it could be from alcohol or sepsis.   LABORATORY AND RADIOLOGICAL DATA DURING THE HOSPITAL COURSE:  Included:  An EKG that showed sinus tachycardia, left anterior fascicular block.  Liver function tests:  Total bilirubin 4.7, alkaline phosphatase 134, AST 106, AST 385, total protein 9, albumin 2.4. INR 1.4. BNP 11,153. Glucose 97, BUN 23, creatinine 3.27, sodium 135, potassium 3.4, chloride 98, CO2 17, calcium 8.7. White blood cell count 26.4, H and H 13.1 and 39.4, platelet count of 93,000. Troponin 2.2. Chest x-ray showed cardiomegaly without evidence of cardiopulmonary disease. Blood cultures grew out strep pyogenes in all four bottles, pansensitive. ABG showed a pH of 7.34, pCO2 of 30, pO2 of 74. Bicarb 16.2, O2 saturation 96.8. Ultrasound of the abdomen:  Hepatic splenomegaly, hepatic steatosis. Troponin came down to 1.9. CT scan of the chest, abdomen and pelvis without contrast showed mild atelectasis, potential pneumonitis or pneumonia at the left lung base, mild interstitial edema, hepatic steatosis, cirrhotic change of the liver, left external iliac adenopathy, inguinal adenopathy, subcutaneous edema, proximal left thigh. Troponin came down to 1.6. Hepatitis Lin surface antibody nonreactive. Hepatitis Lin surface antigen negative. Uric acid 8.0, HIV test negative. Protein Electrophoresis:  Albumin low at 2.9, gamma globulin high at 3.4, total globulin high at 5.4. Hepatitis  C negative. Hemoglobin A1c 5.7. Free kappa and lambda light chains:  Both are high at 158.6 and 160.0. Complement normal range at 17. C3 171, normal range. Antiglomerular basement membrane 9. ANKA panel negative. ANA negative. Magnesium low at 0.6. Urine toxicology negative. Urine growth. No growth. Stool for C. diff. negative.   Echo:  EF 70% to 75%. Magnesium was replaced up to 1.5. On November 22, white count down to 17, platelet down to 55.  November 22, creatinine down to 2.37. Ultrasound of the lower extremities:  No DVT. Chest x-ray on the 27th, minimal subsegmental atelectasis in the lung base. Creatinine on the 23rd, 1.57, white count 11.9. ASO titer high at 635. Anti-DNA Lin-strep antibodies high at 1520. Creatinine upon discharge 1.08, glucose 115, potassium 3.5. Urinalysis negative.   CONSULTANTS DURING THE HOSPITAL COURSE:  Included:  Dr. Juliann Pares, Cardiology, Dr. Thedore Mins and Dr. Cherylann Ratel, Nephrology.  HOSPITAL COURSE PER PROBLEM LIST:  1.  For the patient's severe sepsis, I believe this is all secondary to the cellulitis of his left lower extremity, which grew out positive blood cultures of strep pyogenes in all 4 blood cultures. Questionable pneumonia in the left lower lobe. The patient was initially started on vancomycin, Zosyn and Levaquin and switched over to high-dose Rocephin and sent home on p.o. Keflex. The patient was in the hospital for IV antibiotics for 9 days, sent home on another 10 days worth of antibiotics. The patient can follow up with Dr. Sampson Goon as an outpatient. Initially when he presented, the left lower extremity did not look that bad, worsened throughout the hospital stay. We elevated the leg on 6 pillows and it seemed better on the 28th, but still was pretty diffuse, but the redness had started to come down and was less red. No signs of sepsis upon discharge. The patient was discharged home in stable condition.  2.  For his acute respiratory failure, initially required oxygen during the hospital course, but then was tapered off and discharged home on no oxygen.  3.  The elevated troponin. Seen in consultation by Cardiology. Heparin drip was stopped. This was believed all secondary to the severe sepsis and acute renal failure.  4.  Acute renal failure, likely secondary to the severe sepsis. Could also be secondary to the Streptococcus infection. The patient's creatinine improved to a baseline and the patient was  prescribed his usual Lasix upon discharge.  5.  Elevated liver function tests, likely secondary to cirrhosis. Follow up as an outpatient.  6.  Alcohol abuse. Must stop alcohol at all costs. He was put on CIWA protocol during the hospital course  7.  Chronic lower extremity edema, worsened with the cellulitis on the left lower extremity. He will be on Lasix 40 mg daily.  8.  Thrombocytopenia, likely secondary to severe sepsis and alcohol abuse. Platelet count started to recover but is still on the lower side upon discharge. The last platelet count was 105.  9. Thrush. He was put on Diflucan for a course, advised to rinse out mouth after inhalers.   TIME SPENT ON DISCHARGE:  40 minutes.  ____________________________ Herschell Dimes. Renae Gloss, MD rjw:jm D: 07/11/2013 14:40:18 ET T: 07/11/2013 15:09:38 ET JOB#: [id number]  cc: Herschell Dimes. Renae Gloss, MD, <Dictator> Teena Irani. Terance Hart, MD Stann Mainland. Sampson Goon, MD Salley Scarlet MD ELECTRONICALLY SIGNED 07/12/2013 15:18

## 2014-12-05 NOTE — Consult Note (Signed)
PATIENT NAME:  Oscar Lin, Oscar Lin MR#:  [id number] DATE OF BIRTH:  [birth year]  DATE OF CONSULTATION:  07/04/2013  CONSULTING PHYSICIAN:  Bhakti Labella D. Juliann Pares, MD  REFERRING PHYSICIAN: Dr. Luberta Mutter    The patient sees Dr. Lavenia Atlas for Rheumatology.    CHIEF COMPLAINT: Shortness of breath, elevated troponin.   HISTORY OF PRESENT ILLNESS: The patient is a 59 year old obese white male with history of psoriatic arthritis on Humira being followed by Dr. Gavin Potters,  presented with confusion and not feeling well for about 2 weeks.  He has had disorientation. The patient drinks about 10 vodkas every day, and has been feeling poorly over the last two weeks with worsening shortness of breath. Denied any pain with his confusion and weakness and disorientation and delusions. He was brought to the hospital for further evaluation and management. He was found to have a fever of 103 with sats of 88 so he was subsequently admitted for possible multiorgan failure because he had elevated white count as well as other organ system failure.   PAST MEDICAL HISTORY: 1.  Alcohol abuse.  2.  Obesity.  3.  Psoriatic arthritis 4.  Possible obstructive sleep apnea. 5.  Edema.   PAST SURGICAL HISTORY: Appendectomy.   ALLERGIES: None.   MEDICATIONS: He is on Humira 40 subcutaneous, aspirin 2 tablets a day, Prilosec 20 p.o. daily.   SOCIAL HISTORY: Lives at home. Lives with a friend. Quit smoking 10 years ago. Alcohol abuser.   FAMILY HISTORY:  Kidney disease.   REVIEW OF SYSTEMS: Denies blackout spells or syncope. Denies nausea or vomiting. He has had a fever. No sweats. Denies weight loss. No weight gain. No hemoptysis or hematemesis. No bright red blood per rectum. No vision change or hearing change. Denies sputum production and cough. He had confusion and delirium.  PHYSICAL EXAMINATION: VITAL SIGNS: Blood pressure 110/50, pulse 115 and regular, respiratory rate 16, afebrile. T Max 103.  HEENT: Normocephalic,  atraumatic. Pupils equal and reactive to light.  NECK: Supple. No JVD, bruits or adenopathy.  LUNGS: Clear to auscultation and percussion.  No significant wheezing   HEART: Regular rhythm, tachycardic. Systolic ejection murmur at the apex.  ABDOMEN: Benign.  EXTREMITIES: Within normal limits except for 3+ edema.  NEUROLOGIC: Grossly intact.  SKIN: Normal.   LABORATORIES: White count of 26,000, hemoglobin 13, hematocrit 39, platelet count 193, sodium 135, potassium 2.4, chloride 98, BUN 23, creatinine 2.27, glucose 97. BNP 11,000, INR 1.4, total bilirubin 4.7, direct bilirubin 3.7, ALT 106, AST 385, alcohol level was normal. Chest x-ray shows cardiomegaly, otherwise negative with low volumes. ABG: 7.34, pCO2 30, pO2 74. Sats of 97 on 2 liters.  Abdominal ultrasound shows hepatomegaly, splenomegaly and possible hepatic steatosis.  Cardiac enzymes elevated, troponin at 2.2. CT of the chest, abdomen and pelvis: Atelectasis, possible pneumonitis, pneumonia in the left lung. CT of the abdomen showed enlargement of the caudate lobe with possible cirrhosis.   EKG: Sinus tachycardia, nonspecific finding.   ASSESSMENT: 1.  Sepsis.  2.  Respiratory failure.  3.  Elevated troponin.   4.  Hepatitis.  5.  Alcohol abuse.  6.  Obesity.  7.  Possible obstructive sleep apnea.  8.  Acute thrombocytopenia. 9.  Possible pneumonia.   PLAN: Continue current care. Recommend ICU care. Recommend broad spectrum antibiotic therapy. Recommend controlling diabetes. Recommend follow up cardiac enzymes. Recommend EKG followup.  Consider echocardiogram. Recommend low dose heparin for now and once he switch to deep vein thrombosis prophylaxis. Would recommend evaluation  by Rheumatology. Continue psoriatic arthritis therapy. I am worried because he is on Humira and he has reduced ability and now he is immunosuppressed which will lead to significant sepsis. For GERD continue Prilosec therapy.  Alcohol abuse, would watch for  withdrawal. Follow-up liver enzymes. Continue to treat pneumonia. Follow up renal insufficiency and consult Nephrology. The patient has elevated BNP possibly related to heart failure. Continue current therapy. We will continue to follow the patient but no direct cardiac studies are necessary at this point.   ____________________________ Jakarie Pember D. Juliann Paresallwood, MD ddcBobbie Stack:dp D: 07/04/2013 17:08:50 ET T: 07/04/2013 17:28:17 ET JOB#: [id number]  cc: Shayden Bobier D. Juliann Paresallwood, MD, <Dictator> Alwyn PeaWAYNE D Serenitee Fuertes MD ELECTRONICALLY SIGNED 08/20/2013 10:24

## 2016-01-21 ENCOUNTER — Emergency Department: Payer: Self-pay

## 2016-01-21 ENCOUNTER — Emergency Department
Admission: EM | Admit: 2016-01-21 | Discharge: 2016-01-21 | Disposition: A | Discharge status: Other Acute Inpatient Hospital | Payer: Self-pay | Attending: Emergency Medicine | Admitting: Emergency Medicine

## 2016-01-21 DIAGNOSIS — G40901 Epilepsy, unspecified, not intractable, with status epilepticus: Secondary | ICD-10-CM | POA: Insufficient documentation

## 2016-01-21 DIAGNOSIS — I629 Nontraumatic intracranial hemorrhage, unspecified: Secondary | ICD-10-CM | POA: Insufficient documentation

## 2016-01-21 LAB — BLOOD GAS, ARTERIAL
ACID-BASE EXCESS: 6.3 mmol/L — AB (ref 0.0–3.0)
Allens test (pass/fail): POSITIVE — AB
BICARBONATE: 31.3 meq/L — AB (ref 21.0–28.0)
FIO2: 0.8
LHR: 18 {breaths}/min
MECHVT: 550 mL
O2 Saturation: 97.2 %
PATIENT TEMPERATURE: 37
PEEP/CPAP: 5 cmH2O
PH ART: 7.45 (ref 7.350–7.450)
PO2 ART: 89 mmHg (ref 83.0–108.0)
pCO2 arterial: 45 mmHg (ref 32.0–48.0)

## 2016-01-21 LAB — CBC
HEMATOCRIT: 44.1 % (ref 40.0–52.0)
HEMOGLOBIN: 15.2 g/dL (ref 13.0–18.0)
MCH: 38.8 pg — AB (ref 26.0–34.0)
MCHC: 34.6 g/dL (ref 32.0–36.0)
MCV: 112.3 fL — AB (ref 80.0–100.0)
PLATELETS: 85 10*3/uL — AB (ref 150–440)
RBC: 3.93 MIL/uL — AB (ref 4.40–5.90)
RDW: 17.4 % — ABNORMAL HIGH (ref 11.5–14.5)
WBC: 9 10*3/uL (ref 3.8–10.6)

## 2016-01-21 LAB — COMPREHENSIVE METABOLIC PANEL
ALK PHOS: 103 U/L (ref 38–126)
ALT: 42 U/L (ref 17–63)
AST: 108 U/L — ABNORMAL HIGH (ref 15–41)
Albumin: 3.9 g/dL (ref 3.5–5.0)
Anion gap: 14 (ref 5–15)
BILIRUBIN TOTAL: 4.2 mg/dL — AB (ref 0.3–1.2)
BUN: 13 mg/dL (ref 6–20)
CO2: 30 mmol/L (ref 22–32)
Calcium: 8.5 mg/dL — ABNORMAL LOW (ref 8.9–10.3)
Chloride: 98 mmol/L — ABNORMAL LOW (ref 101–111)
Creatinine, Ser: 0.95 mg/dL (ref 0.61–1.24)
GLUCOSE: 119 mg/dL — AB (ref 65–99)
Potassium: 3.9 mmol/L (ref 3.5–5.1)
Sodium: 142 mmol/L (ref 135–145)
TOTAL PROTEIN: 8.6 g/dL — AB (ref 6.5–8.1)

## 2016-01-21 LAB — PROTIME-INR
INR: 1.15
Prothrombin Time: 14.9 seconds (ref 11.4–15.0)

## 2016-01-21 MED ORDER — LORAZEPAM 2 MG/ML IJ SOLN
4.0000 mg | Freq: Once | INTRAMUSCULAR | Status: AC
  Administered 2016-01-21: 4 mg via INTRAVENOUS

## 2016-01-21 MED ORDER — PYRIDOXINE HCL 100 MG/ML IJ SOLN
100.0000 mg | Freq: Once | INTRAMUSCULAR | Status: DC
Start: 2016-01-21 — End: 2016-01-21
  Filled 2016-01-21: qty 1

## 2016-01-21 MED ORDER — PROPOFOL 1000 MG/100ML IV EMUL
5.0000 ug/kg/min | Freq: Once | INTRAVENOUS | Status: AC
  Administered 2016-01-21: 20 ug/kg/min via INTRAVENOUS

## 2016-01-21 MED ORDER — SUCCINYLCHOLINE CHLORIDE 20 MG/ML IJ SOLN
120.0000 mg | Freq: Once | INTRAMUSCULAR | Status: AC
  Administered 2016-01-21: 120 mg via INTRAVENOUS

## 2016-01-21 MED ORDER — SODIUM CHLORIDE 0.9 % IV SOLN
1500.0000 mg | Freq: Once | INTRAVENOUS | Status: AC
  Administered 2016-01-21: 1500 mg via INTRAVENOUS
  Filled 2016-01-21: qty 15

## 2016-01-21 MED ORDER — SODIUM CHLORIDE 0.9 % IV SOLN
1000.0000 mg | Freq: Once | INTRAVENOUS | Status: DC
  Filled 2016-01-21: qty 20

## 2016-01-21 MED ORDER — SODIUM CHLORIDE 0.9 % IV SOLN
4.0000 mg/h | INTRAVENOUS | Status: DC
  Filled 2016-01-21: qty 10

## 2016-01-21 MED ORDER — LORAZEPAM 2 MG/ML IJ SOLN
INTRAMUSCULAR | Status: AC
  Administered 2016-01-21: 4 mg via INTRAVENOUS
  Filled 2016-01-21: qty 2

## 2016-01-21 NOTE — ED Notes (Signed)
King Airway removed at this time by Dr. Lenard LancePaduchowski.

## 2016-01-21 NOTE — ED Notes (Signed)
Patient transported to CT with RNs at bedside.

## 2016-01-21 NOTE — ED Provider Notes (Addendum)
[facility] Emergency Department Provider Note  Time seen: 3:44 PM  I have reviewed the triage vital signs and the nursing notes.   HISTORY  Chief Complaint No chief complaint on file.    HPI Oscar Lin is a 60 y.o. male with a past medical history of alcoholism, no known other medical history who presents to the emergency department in status epilepticus. According to EMS the patient was found down at his house, seizing. Unknown trauma. Per EMS the patient was on the couch upon arrival. EMS states at least 45 minutes of seizure activity prior to arrival to the hospital. EMS gave the patient 4 mg of Versed and 14 mg of Valium prior to arrival. Upon arrival the patient remains in status epilepticus, with seizure-like activity of the face and right upper extremity. Patient is unresponsive with unequal pupils. A King airway is in place by EMS with blood in the Barnard airway tube. EMS states it was a difficult placement. Patient febrile to 100.3 upon arrival, tachycardic in the 140s, blood pressure 187/107.     No past medical history on file.  There are no active problems to display for this patient.   No past surgical history on file.  No current outpatient prescriptions on file.  Allergies Review of patient's allergies indicates no known allergies.  No family history on file.  Social History Social History  Substance Use Topics  . Smoking status: Not on file  . Smokeless tobacco: Not on file  . Alcohol Use: Not on file    Review of Systems Unable to obtain review of systems, status epilepticus, unresponsive.  ____________________________________________   PHYSICAL EXAM:  VITAL SIGNS: ED Triage Vitals  Enc Vitals Group     BP 01/21/16 1458 187/107 mmHg     Pulse --      Resp --      Temp 01/21/16 1458 100.3 F (37.9 C)     Temp Source 01/21/16 1458 Axillary     SpO2 01/21/16 1458 97 %     Weight --      Height --      Head Cir --       Peak Flow --      Pain Score --      Pain Loc --      Pain Edu? --      Excl. in GC? --     Constitutional: Unresponsive. Eyes: 6 mm left pupil, 4 mm right pupil, neither which react to light. ENT   Head: Normocephalic, small area of ecchymosis below the left eye.   Mouth/Throat: Blood coming from the oropharynx. Cardiovascular: Regular rhythm, rate around 140 bpm. Respiratory: Breath sounds are clear and equal bilaterally with King airway in place. Gastrointestinal: No abdominal distention. No signs of abdominal trauma. Musculoskeletal: Extremities appear atraumatic, intraosseous to be a catheter in lower extremity. Neurologic:  Unresponsive. Patient has seizure-like activity in the face, and right upper extremity. Skin:  Skin is warm, dry Psychiatric: Unable to assess due to unresponsiveness.  ____________________________________________     RADIOLOGY  CT consistent with large left-sided subdural hematoma with market midline shift.  ____________________________________________  CRITICAL CARE Performed by: Minna Antis   Total critical care time: 60 minutes  Critical care time was exclusive of separately billable procedures and treating other patients.  Critical care was necessary to treat or prevent imminent or life-threatening deterioration.  Critical care was time spent personally by me on the following activities: development of treatment plan  with patient and/or surrogate as well as nursing, discussions with consultants, evaluation of patient's response to treatment, examination of patient, obtaining history from patient or surrogate, ordering and performing treatments and interventions, ordering and review of laboratory studies, ordering and review of radiographic studies, pulse oximetry and re-evaluation of patient's condition.   INITIAL IMPRESSION / ASSESSMENT AND PLAN / ED COURSE  Pertinent labs & imaging results that were available during my  care of the patient were reviewed by me and considered in my medical decision making (see chart for details).  The patient presents the emergency department in status epilepticus. Patient continues to seize upon arrival. Patient received 4 mg of Versed, 40 mg of Valium prior to arrival. Upon arrival the patient received several boluses of 4 mg of Ativan totaling 12 mg of Ativan. Started on a propofol drip. [facility] and the patient then dissipated. ENT ET tube. Patient started on a Keppra bolus. A Versed drip 2 mg per hour started. Dilantin ordered however the patient stopped seizing prior to Dilantin infusion. We will hold off on Dilantin infusion at this time. Vitamin B6 ordered as well. Patient has now stopped seizing however in total the patient likely seize for approximately one hour or more prior to arrival to the hospital and another 20-30 minutes in the emergency department before we were finally able to stop the seizures.  During intubation the patient was given a dose of succinylcholine to help with intubation, we were able to get the patient to the CT scan area during this intervals well to obtain CT imaging. I accompanied the patient to the CT scanner, and saw the large left-sided subdural hematoma. We immediately called [facility] to arrange a transfer via helicopter. Patient's blood pressure has decreased currently 115 systolic. Heart rate is down to 115 bpm. [facility] Patient accepted by Dr.Katz.     INTUBATION Performed by: Minna AntisPADUCHOWSKI, Keithan Dileonardo  Required items: required blood products, implants, devices, and special equipment available Patient identity confirmed: provided demographic data and hospital-assigned identification number Time out: Immediately prior to procedure a "time out" was called to verify the correct patient, procedure, equipment, support staff and site/side marked as  required.  Indications: unresponsive   Intubation method: 4.0 Glidescope Laryngoscopy   Preoxygenation: 100%BVM  Sedatives:Recieved significant doses of Versed, Valium, Ativan and on a propofol drip prior to intubation  Paralytic: 120 mg Succinylcholine  Tube Size: 8.0 cuffed  Post-procedure assessment: chest rise and ETCO2 monitor Breath sounds: equal and absent over the epigastrium Tube secured with: ETT holder Chest x-ray interpreted by radiologist and me.  Chest x-ray findings:endotracheal tube in appropriate position  Patient tolerated the procedure well with no immediate complications.   Unable to find any contact numbers for family. I was able to find a current home phone number through care everywhere but no emergency contact listed in. No one answers the home phone. I attempted calling his work phone, with no answer. [facility].   ____________________________________________   FINAL CLINICAL IMPRESSION(S) / ED DIAGNOSES  Status epilepticus Intercranial hemorrhage   Minna AntisKevin Raydon Chappuis, MD 01/21/16 1556  Minna AntisKevin Acelin Ferdig, MD 01/21/16 [phone] [id number]

## 2016-02-12 DEATH — deceased

## 2018-02-19 IMAGING — DX DG CHEST 1V PORT
1 series · 1 of 1 positions shown · non-contrast
Comparison: 07/05/2013

CLINICAL DATA: Intubation.

EXAM:
PORTABLE CHEST 1 VIEW

[chest ap]
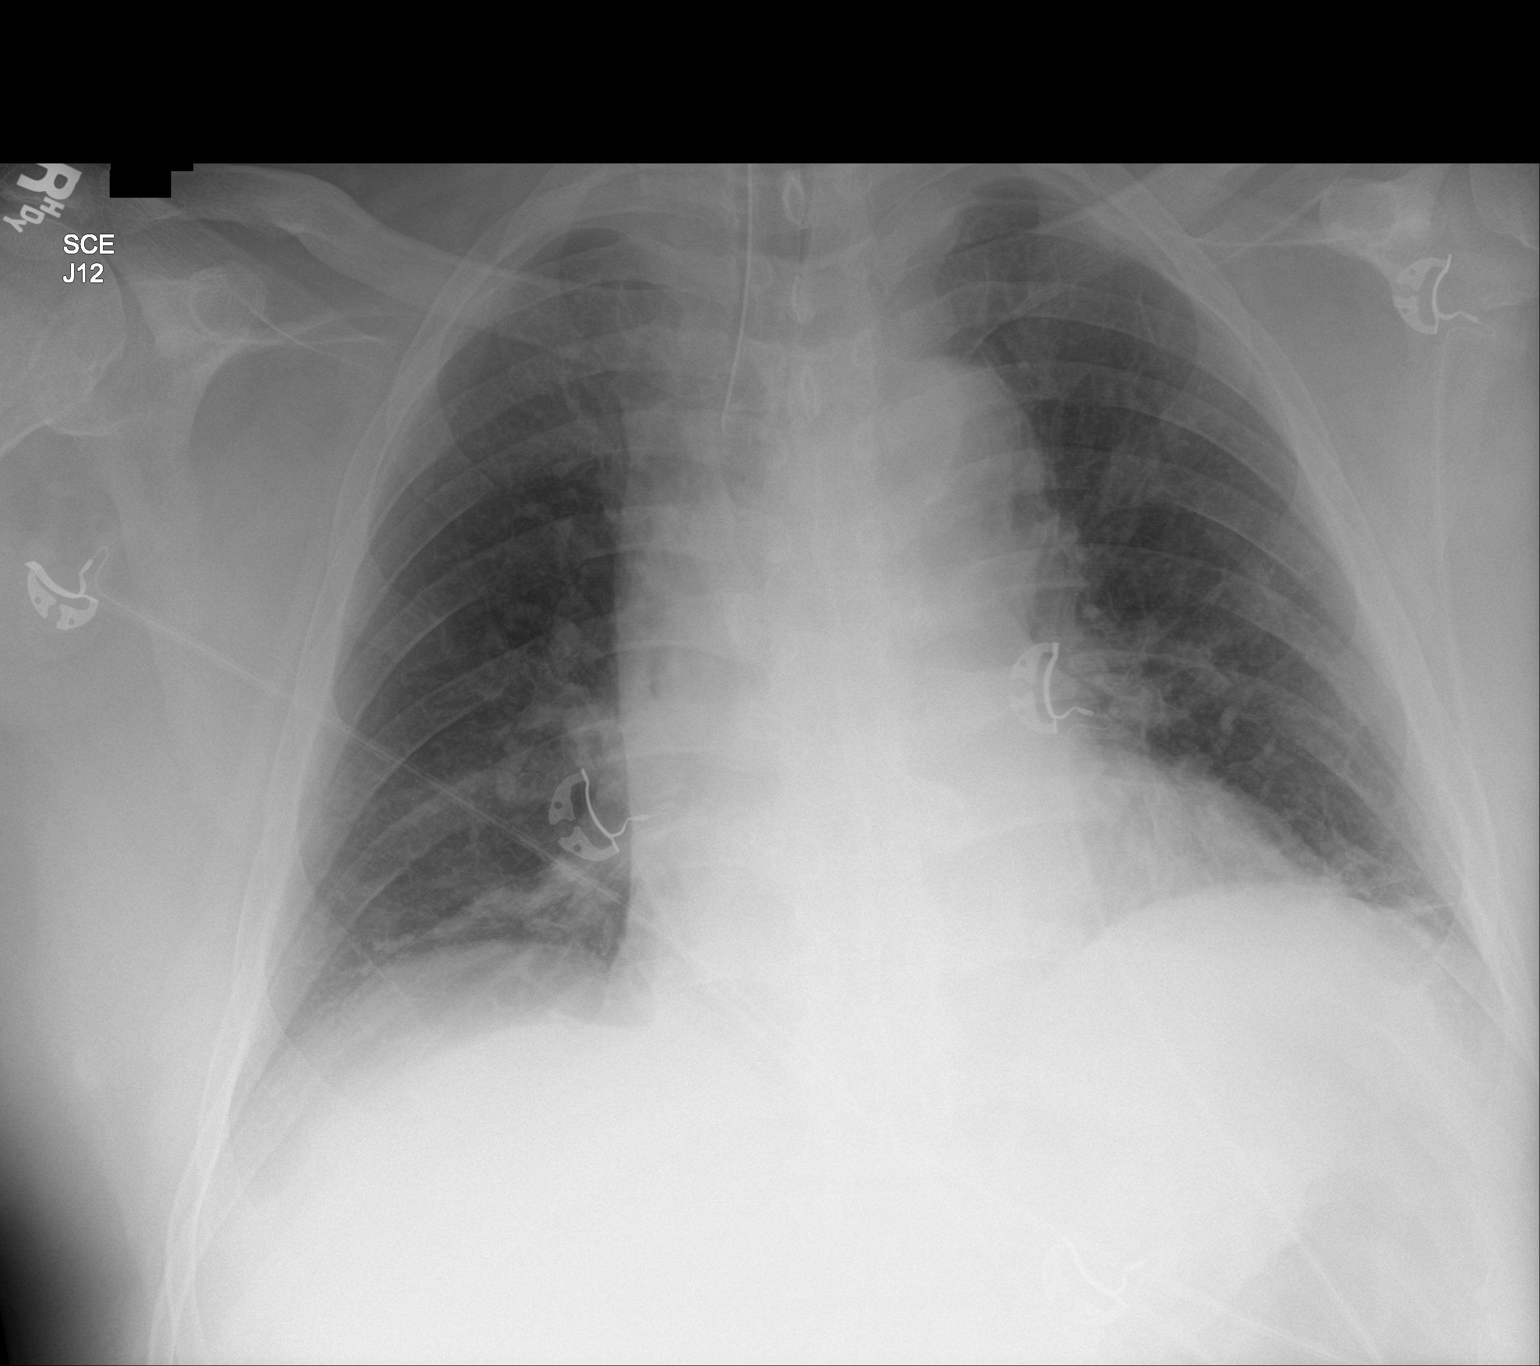

[1 of 1 positions shown; findings below may reference images not displayed]

FINDINGS: The heart is mildly enlarged but stable. There is moderate
tortuosity of the thoracic aorta. The endotracheal to is an good
position. The tip is 5 cm above the carina. The lungs demonstrate
mild chronic bronchitic changes and streaky bibasilar atelectasis.
The bony thorax is intact.
IMPRESSION: The endotracheal tube tip is 5 cm above the carina.

Mild stable cardiac enlargement and tortuosity of the thoracic
aorta.

Streaky bibasilar atelectasis but no infiltrates or effusions.

## 2018-02-19 IMAGING — CT CT HEAD W/O CM
3 series · 16 of 47 positions shown, 19 images · non-contrast
Comparison: None.

CLINICAL DATA: Patient found unresponsive with seizure

EXAM:
CT HEAD WITHOUT CONTRAST
TECHNIQUE: Contiguous axial images were obtained from the base of the skull
through the vertex without intravenous contrast.

[Series 2: head wo · axial · 0.39mm/px · z∈[-50,+84]mm · 10 of 33 slices shown, 13 images]
[im 3/33  brain]
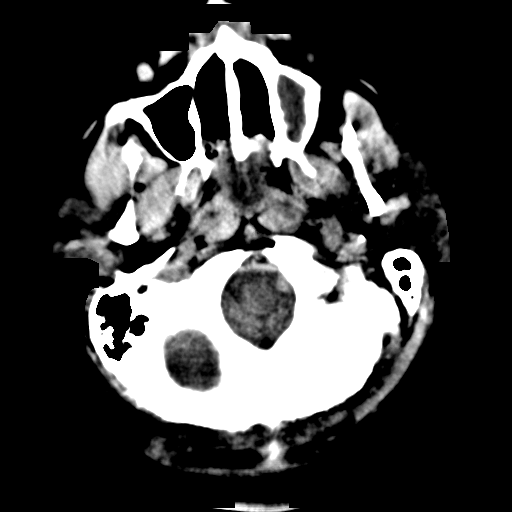
[im 3/33  bone]
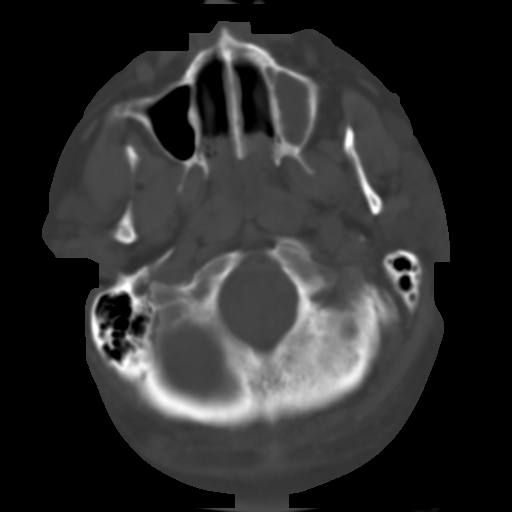
[im 6/33  brain]
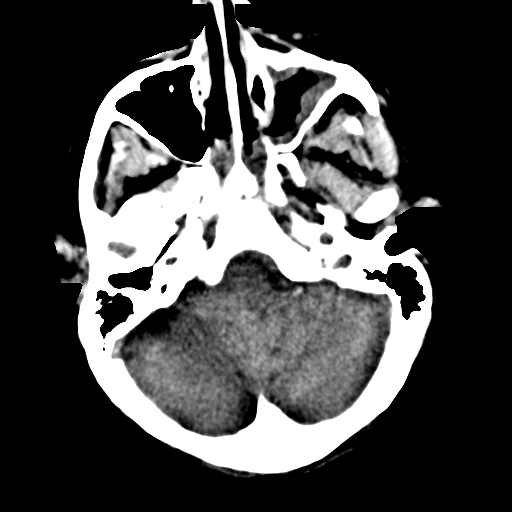
[im 9/33  brain]
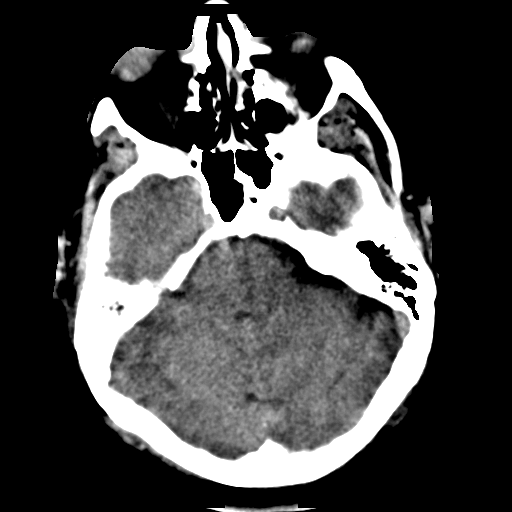
[im 12/33  brain]
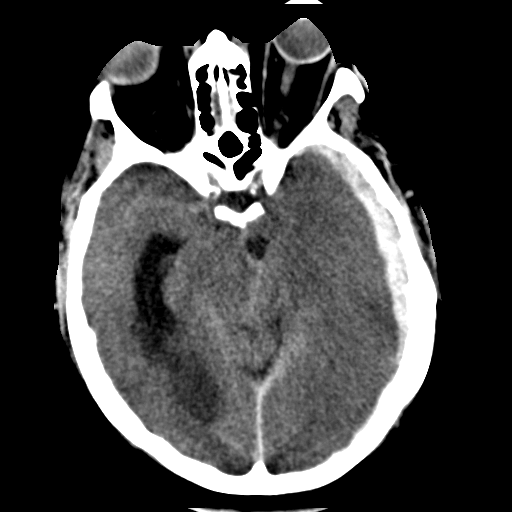
[im 15/33  brain]
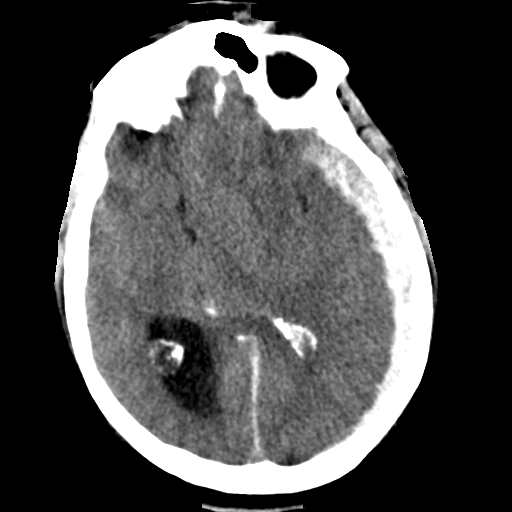
[im 15/33  bone]
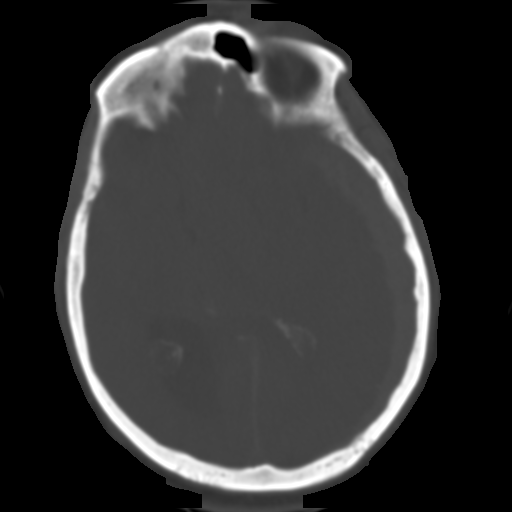
[im 18/33  brain]
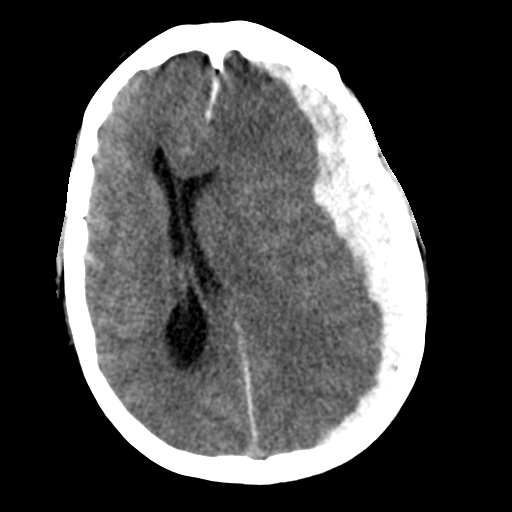
[im 21/33  brain]
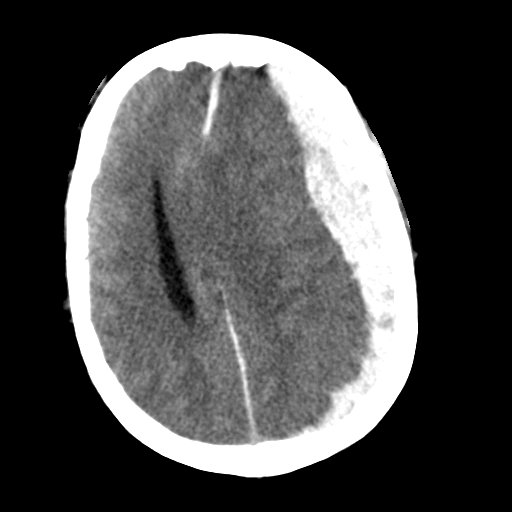
[im 25/33  brain]
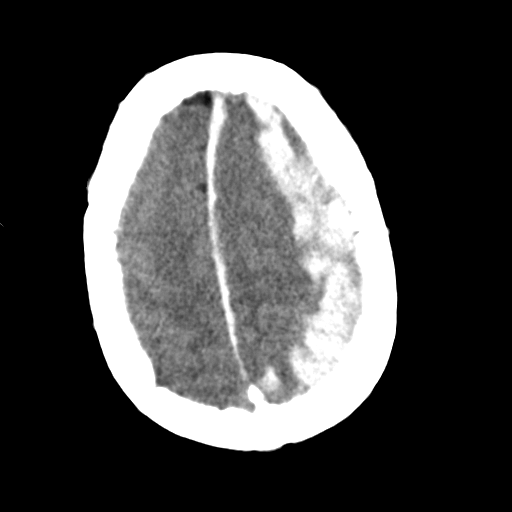
[im 27/33  brain]
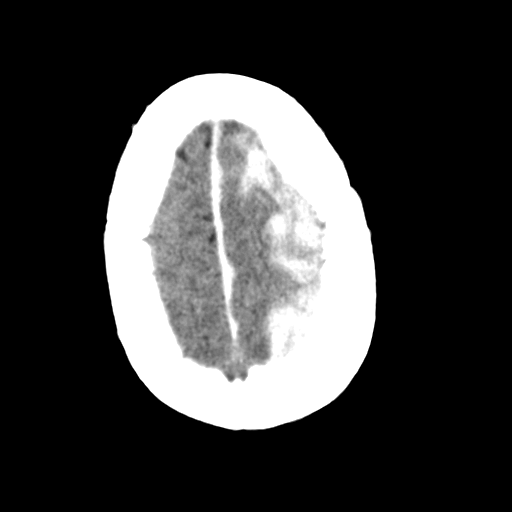
[im 27/33  bone]
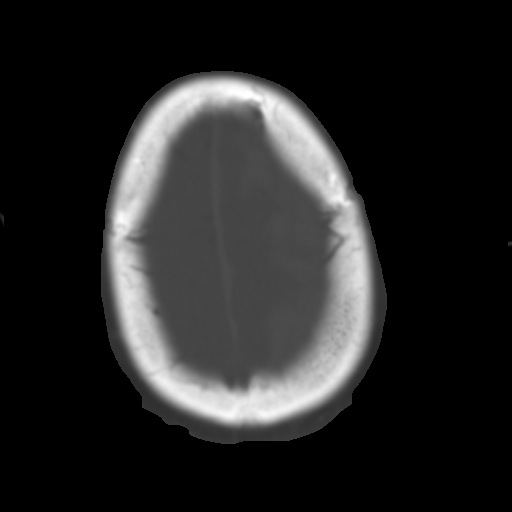
[im 30/33  brain]
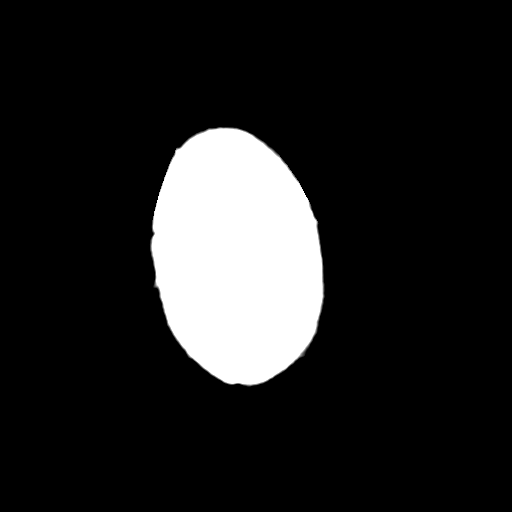

[Series 4: coronal soft tissue · coronal · 0.39mm/px · 3 of 76 slices shown]
[im 26/76  brain]
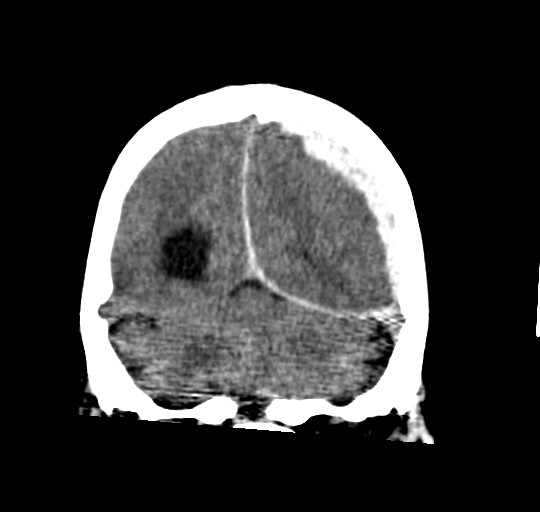
[im 34/76  brain]
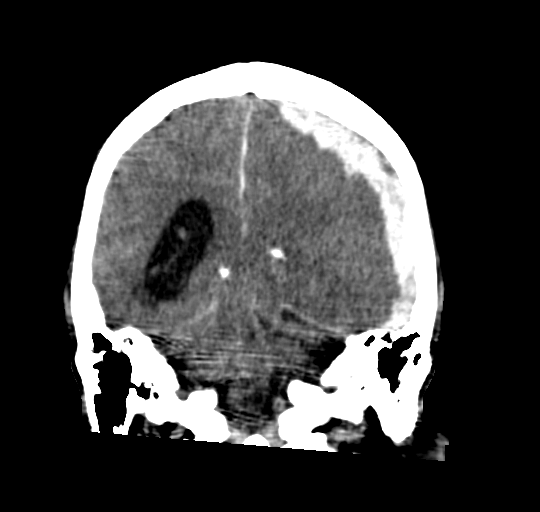
[im 42/76  brain]
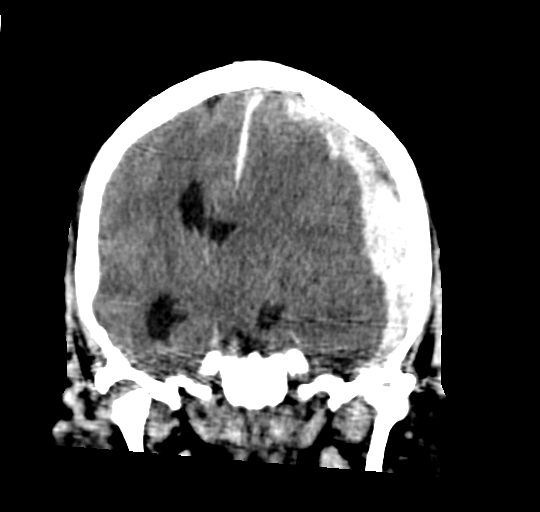

[Series 5: sagittal soft tissue · sagittal · 0.45mm/px · 3 of 59 slices shown]
[im 20/59  brain]
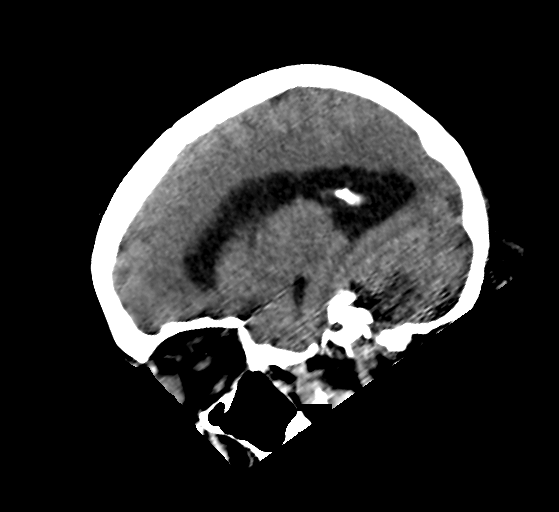
[im 30/59  brain]
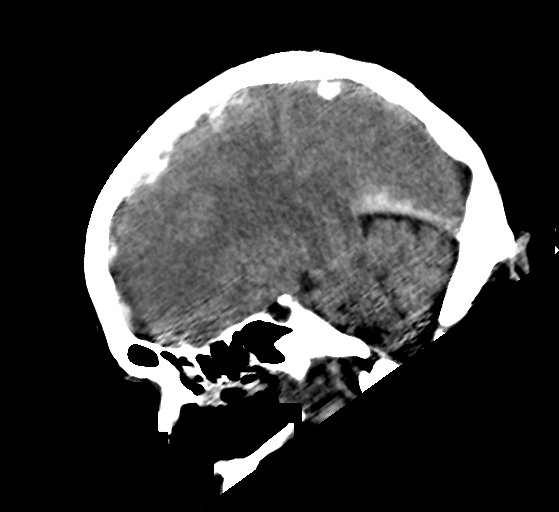
[im 39/59  brain]
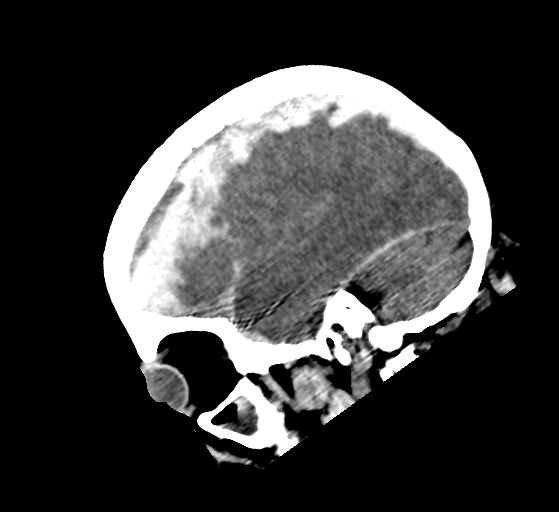

[16 of 47 positions shown; findings below may reference images not displayed]

FINDINGS: There is a left-sided subdural hematoma with a maximum thickness of
2.6 cm. There is midline shift of the lateral ventricles to the
right by 2.0 cm. There is relative effacement of the lateral
ventricles except for enlargement of the temporal and occipital
horns of the right lateral ventricle due to trapping phenomenon.
There is thickening of the falx consistent with subdural hemorrhage
within the falx. There is no demonstrable intra-axial hemorrhage.
There is no mass. No acute infarct evident. Gray-white compartments
appear normal. Bony calvarium appears intact. The mastoid air cells
are clear. No intraorbital lesions are appreciable. There is
opacification in several ethmoid air cells bilaterally. There is
also extensive mucosal thickening in the left maxillary antrum.
IMPRESSION: Sizable left-sided subdural hematoma with marked midline shift
toward the right. There is also acute subdural hemorrhage in the
falx. There is trapping of the temporal and occipital horns of the
right lateral ventricle. There is no intra-axial hemorrhage or mass
lesion. No acute infarct. Areas of paranasal sinus disease noted.

Critical Value/emergent results were called by telephone at the time
of interpretation on 01/21/2016 at [DATE] to Dr. Briant Saha , who
verbally acknowledged these results.
